# Patient Record
Sex: Female | Born: 1960
Health system: Southern US, Community
[De-identification: ages and names within clinical notes are randomized; demographics above are authoritative.]

## PROBLEM LIST (undated history)

## (undated) DIAGNOSIS — I10 Essential (primary) hypertension: Secondary | ICD-10-CM

## (undated) DIAGNOSIS — G510 Bell's palsy: Secondary | ICD-10-CM

## (undated) DIAGNOSIS — T7840XA Allergy, unspecified, initial encounter: Secondary | ICD-10-CM

## (undated) DIAGNOSIS — D649 Anemia, unspecified: Secondary | ICD-10-CM

## (undated) DIAGNOSIS — F32A Depression, unspecified: Secondary | ICD-10-CM

## (undated) DIAGNOSIS — Z5189 Encounter for other specified aftercare: Secondary | ICD-10-CM

## (undated) DIAGNOSIS — F329 Major depressive disorder, single episode, unspecified: Secondary | ICD-10-CM

## (undated) HISTORY — DX: Major depressive disorder, single episode, unspecified: F32.9

## (undated) HISTORY — DX: Encounter for other specified aftercare: Z51.89

## (undated) HISTORY — DX: Allergy, unspecified, initial encounter: T78.40XA

## (undated) HISTORY — PX: TONSILLECTOMY: SUR1361

## (undated) HISTORY — PX: CARPAL TUNNEL RELEASE: SHX101

## (undated) HISTORY — DX: Anemia, unspecified: D64.9

## (undated) HISTORY — DX: Depression, unspecified: F32.A

## (undated) HISTORY — DX: Essential (primary) hypertension: I10

## (undated) HISTORY — DX: Bell's palsy: G51.0

---

## 1999-02-13 ENCOUNTER — Ambulatory Visit (HOSPITAL_COMMUNITY): Admission: RE | Admit: 1999-02-13 | Discharge: 1999-02-13 | Payer: Self-pay | Admitting: Family Medicine

## 1999-02-13 ENCOUNTER — Encounter: Payer: Self-pay | Admitting: Family Medicine

## 1999-05-31 ENCOUNTER — Observation Stay (HOSPITAL_COMMUNITY): Admission: EM | Admit: 1999-05-31 | Discharge: 1999-06-01 | Payer: Self-pay | Admitting: Emergency Medicine

## 1999-05-31 ENCOUNTER — Encounter: Payer: Self-pay | Admitting: Emergency Medicine

## 1999-06-01 ENCOUNTER — Encounter: Payer: Self-pay | Admitting: Surgery

## 2000-03-09 ENCOUNTER — Encounter: Payer: Self-pay | Admitting: Family Medicine

## 2000-03-09 ENCOUNTER — Ambulatory Visit (HOSPITAL_COMMUNITY): Admission: RE | Admit: 2000-03-09 | Discharge: 2000-03-09 | Payer: Self-pay | Admitting: Family Medicine

## 2001-09-16 ENCOUNTER — Ambulatory Visit (HOSPITAL_COMMUNITY): Admission: RE | Admit: 2001-09-16 | Discharge: 2001-09-16 | Payer: Self-pay | Admitting: Family Medicine

## 2001-09-16 ENCOUNTER — Encounter: Payer: Self-pay | Admitting: Family Medicine

## 2002-11-10 ENCOUNTER — Ambulatory Visit (HOSPITAL_COMMUNITY): Admission: RE | Admit: 2002-11-10 | Discharge: 2002-11-10 | Payer: Self-pay | Admitting: Family Medicine

## 2002-11-10 ENCOUNTER — Encounter: Payer: Self-pay | Admitting: Family Medicine

## 2006-12-23 ENCOUNTER — Ambulatory Visit: Payer: Self-pay | Admitting: Cardiology

## 2007-09-16 ENCOUNTER — Ambulatory Visit (HOSPITAL_COMMUNITY): Admission: RE | Admit: 2007-09-16 | Discharge: 2007-09-16 | Payer: Self-pay | Admitting: Internal Medicine

## 2008-11-02 ENCOUNTER — Ambulatory Visit (HOSPITAL_COMMUNITY): Admission: RE | Admit: 2008-11-02 | Discharge: 2008-11-02 | Payer: Self-pay | Admitting: Internal Medicine

## 2009-12-04 ENCOUNTER — Ambulatory Visit (HOSPITAL_COMMUNITY): Admission: RE | Admit: 2009-12-04 | Discharge: 2009-12-04 | Payer: Self-pay | Admitting: Internal Medicine

## 2010-09-17 NOTE — Assessment & Plan Note (Signed)
[facility]                            CARDIOLOGY OFFICE NOTE   NAME:Sporer, TRANY CHERNICK                     MRN:          [id number]  DATE:12/23/2006                            DOB:          1960/11/13    CARDIAC CONSULTATION:   REASON FOR REFERRAL:  Management of hypertension.   CLINICAL HISTORY:  Ms. 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  She recently was seen at  Urgent Care for a nonrelated problem and was found to have an elevated  blood pressure.  She subsequently monitored her pressures at home and  they have been for the most part normal, although she does have readings  in the 145 to 150 range in the mornings.  She has been in the 180 to 200  range over 140 on occasions in the urgent medical center according to  her account.  She says she has no symptoms related to her high blood  pressure and specifically has no headache.  She also has no chest pain,  shortness of breath or palpitations.   PAST MEDICAL HISTORY:  1. Borderline diabetes.  She has a mildly elevated fasting blood sugar      and hemoglobin A1c and is apparently on a diet for this.  2. She has a history of two C-sections and wrist surgery and a D&C.   CURRENT MEDICATIONS:  Hydrochlorothiazide and Pepcid.   FAMILY HISTORY:  Her mother died at age 41 of breast cancer.  She also  had significant heart failure probably related to chemotherapy.  Her  father died at age 55 with Alzheimer's.  She has four siblings, none of  whom have heart disease.   SOCIAL HISTORY:  She is married and has two children, who are 26 and 41.  She does not smoke.   REVIEW OF SYSTEMS:  Positive for some lower extremity swelling and  headache.   On examination today, the blood pressure is 158/111, pulse 110 and  regular.  There was no venous distention.  The carotid pulses were full without  bruits.  CHEST:  Clear without rales or rhonchi.  CARDIAC:   Rhythm was regular.  The heart sounds were normal and there  were no murmurs or gallops.  ABDOMEN:  Soft, without organomegaly.  The peripheral pulses were full  and there was trace pitting edema and 1+ nonpitting edema.  MUSCULOSKELETAL:  No deformity.  SKIN:  Warm and dry.  NEUROLOGIC:  No focal neurologic signs.   An electrocardiogram showed sinus tachycardia and left axis deviation  and poor R-wave progression.   IMPRESSION:  1. Hypertension.  2. Borderline diabetes.  3. Excess weight.   RECOMMENDATIONS:  Most of Ms. Doyle's high blood pressure has been in  the office and she has been told she has white coat syndrome.  She was  referred here for possible ambulatory blood pressure monitoring.  Unfortunately, we do not have that currently available to [place] although I  will look into that possibility.  In the meantime we will get an  echocardiogram to see  if there has been any effect of high blood  pressure on her heart.  If she has LVH, then I think we are going to be  more aggressive in treating her.  She said she has been tried on a beta  blocker, seen now for blood pressure monitoring and tried on an ACE  inhibitor, which she did not tolerate.  We might consider a calcium  channel blocker if we decide to treat her.  She is to keep a record of  her blood pressures at home and when she comes in for her  echocardiogram, we will correlate her blood pressures at home with the  blood pressure in the office.  I will also be in touch with her by phone  and we will decide about a follow-up visit and how we should proceed  after we have that information.     Bruce Elvera Lennox Juanda Chance, MD, [facility]  Electronically Signed    BRB/MedQ  DD: 12/23/2006  DT: 12/24/2006  Job #: [id number]

## 2010-12-13 ENCOUNTER — Other Ambulatory Visit: Payer: Self-pay | Admitting: Internal Medicine

## 2010-12-13 DIAGNOSIS — Z1231 Encounter for screening mammogram for malignant neoplasm of breast: Secondary | ICD-10-CM

## 2010-12-19 ENCOUNTER — Ambulatory Visit (HOSPITAL_COMMUNITY)
Admission: RE | Admit: 2010-12-19 | Discharge: 2010-12-19 | Disposition: A | Discharge status: Home / Self Care | Payer: 59 | Source: Ambulatory Visit | Attending: Internal Medicine | Admitting: Internal Medicine

## 2010-12-19 DIAGNOSIS — Z1231 Encounter for screening mammogram for malignant neoplasm of breast: Secondary | ICD-10-CM

## 2010-12-24 ENCOUNTER — Other Ambulatory Visit: Payer: Self-pay | Admitting: Internal Medicine

## 2010-12-24 DIAGNOSIS — R928 Other abnormal and inconclusive findings on diagnostic imaging of breast: Secondary | ICD-10-CM

## 2010-12-30 ENCOUNTER — Ambulatory Visit
Admission: RE | Admit: 2010-12-30 | Discharge: 2010-12-30 | Disposition: A | Discharge status: Home / Self Care | Payer: 59 | Source: Ambulatory Visit | Attending: Internal Medicine | Admitting: Internal Medicine

## 2010-12-30 DIAGNOSIS — R928 Other abnormal and inconclusive findings on diagnostic imaging of breast: Secondary | ICD-10-CM

## 2012-10-12 ENCOUNTER — Other Ambulatory Visit: Payer: Self-pay

## 2012-10-12 DIAGNOSIS — Z1231 Encounter for screening mammogram for malignant neoplasm of breast: Secondary | ICD-10-CM

## 2012-11-01 ENCOUNTER — Ambulatory Visit
Admission: RE | Admit: 2012-11-01 | Discharge: 2012-11-01 | Disposition: A | Discharge status: Home / Self Care | Payer: BC Managed Care – PPO | Source: Ambulatory Visit

## 2012-11-01 DIAGNOSIS — Z1231 Encounter for screening mammogram for malignant neoplasm of breast: Secondary | ICD-10-CM

## 2013-07-26 ENCOUNTER — Ambulatory Visit (INDEPENDENT_AMBULATORY_CARE_PROVIDER_SITE_OTHER): Payer: BC Managed Care – PPO | Admitting: Physician Assistant

## 2013-07-26 VITALS — BP 162/94 | HR 79 | Temp 97.8°F | Resp 18 | Ht 60.0 in | Wt 249.0 lb

## 2013-07-26 DIAGNOSIS — Z8669 Personal history of other diseases of the nervous system and sense organs: Secondary | ICD-10-CM

## 2013-07-26 DIAGNOSIS — I10 Essential (primary) hypertension: Secondary | ICD-10-CM

## 2013-07-26 DIAGNOSIS — Z111 Encounter for screening for respiratory tuberculosis: Secondary | ICD-10-CM

## 2013-07-26 DIAGNOSIS — G510 Bell's palsy: Secondary | ICD-10-CM

## 2013-07-26 DIAGNOSIS — Z23 Encounter for immunization: Secondary | ICD-10-CM

## 2013-07-26 MED ORDER — OLMESARTAN MEDOXOMIL 20 MG PO TABS: 20.0000 mg | ORAL_TABLET | Freq: Every day | ORAL | Status: DC

## 2013-07-26 NOTE — Progress Notes (Signed)
Subjective:    Patient ID: Casey BurkittBridget L Conly, female    DOB: 09/14/1960, 53 y.o.   MRN: [id number]  HPI   Ms. Fredric MareBailey is a very pleasant 53 yr old female here with several concerns:  (1)  She is studying to be a Engineer, sitemedical assistant.  Needs Tdap, Hep B, and TB skin test for school.  She had one dose of Hep b a few years ago but no documentation so needs to restart the series.  Has had TSTs in the past, never positive.  Last had Tb quanitferon gold 2 yrs ago.  See TB questionnaire below  (2)  Needs RF on HTN medication.  Takes Toprol 25mg  once daily.  Did not take medication today.  Occ checks home BPs - usually 140s/80s.  Was on a different BP med in the past - unsure of the name - but this caused dizziness.  She is open to trying a different BP med.  She knows that her diet, stress, and obesity all contribute to her elevated BPs.  She denies CP, SOB, palpitations, HA.  (3)  Reports she has Bell's palsy - now 15 months out from initial diagnosis.  She has been taking valtrex 500mg  daily.  She continues to have residual facial paralysis.  She follows with optometry who has been treating her ocular symptoms.  Pt is frustrated with ongoing symptoms.  Some of the symptoms she initially had have resolved or improved but she continues to have decreased sensation on the right side of her face as well as paralysis. She occ has difficulty speaking due to difficulty moving the right side of her face. She denies word finding difficulty. She denies any other neurological symptoms including weakness, numbness, gait changes, other paresthesias    Tuberculosis Risk Questionnaire  1. No Were you born outside the [place] in one of the following parts of the world: [place], [place], [place], [place] or [place]?    2. Yes [place] x 3 months (20 yrs ago) Have you traveled outside the [place] and lived for more than one month in one of the following parts of the world: [place], [place], [place], [place]  or [place]?    3. No Do you have a compromised immune system such as from any of the following conditions:HIV/AIDS, organ or bone marrow transplantation, diabetes, immunosuppressive medicines (e.g. Prednisone, Remicaide), leukemia, lymphoma, cancer of the head or neck, gastrectomy or jejunal bypass, end-stage renal disease (on dialysis), or silicosis?     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    5. No Have you ever: injected illegal drugs, used crack cocaine, lived in a homeless shelter  or been in jail or prison?     6. No Have you ever been exposed to anyone with infectious tuberculosis?    Tuberculosis Symptom Questionnaire  Do you currently have any of the following symptoms?  1. No Unexplained cough lasting more than 3 weeks?   2. No Unexplained fever lasting more than 3 weeks.   3. No Night Sweats (sweating that leaves the bedclothes and sheets wet)     4. No Shortness of Breath   5. No Chest Pain   6. No Unintentional weight loss    7. No Unexplained fatigue (very tired for no reason)     Review of Systems  Constitutional:  Negative for fever and chills.  Respiratory: Negative for shortness of breath.   Cardiovascular: Negative for chest pain, palpitations and leg swelling.  Gastrointestinal: Negative.   Musculoskeletal: Negative.   Skin: Negative.   Neurological: Positive for facial asymmetry (bell's palsy x 15 months).       Objective:   Physical Exam  Vitals reviewed. Constitutional: She is oriented to person, place, and time. She appears well-developed and well-nourished. No distress.  HENT:  Head: Normocephalic and atraumatic.  Eyes: Conjunctivae are normal. No scleral icterus.  Cardiovascular: Normal rate, regular rhythm and normal heart sounds.   Pulmonary/Chest: Effort normal and breath sounds normal. She has no  wheezes. She has no rales.  Neurological: She is alert and oriented to person, place, and time. A cranial nerve deficit (facial paralysis R side, present x 15 months) is present.  Skin: Skin is warm and dry.  Psychiatric: She has a normal mood and affect. Her behavior is normal.        Assessment & Plan:  Hypertension - Plan: olmesartan (BENICAR) 20 MG tablet  Need for hepatitis B vaccination - Plan: Hepatitis B vaccine adult IM  Need for Tdap vaccination - Plan: Tdap vaccine greater than or equal to 7yo IM  Screening for tuberculosis - Plan: TB Skin Test  Facial paralysis on right side - Plan: Ambulatory referral to Neurology  History of Bell's palsy - Plan: Ambulatory referral to Neurology    Ms. Cansler is a pleasant 53 yr old female here with several concerns: (1)  Needs immunizations for school.  We have administered Tdap, Hep B, and placed a PPD today.  Pt to return in 48-72 for TB read and in 1 month for next Hep B.  (2)  HTN - moderately well controlled BP on 25mg  Toprol, home BPs 140s/80s - today pressure is 162/94 without medication.  I think we may be able to get better BP control with a different medication.  Pt thinks she may have tried lisinopril in the past but didn't tolerate.  Will start 20mg  olmesartan today.  Check home BPs 1-2 time per week.  Cautioned about highs and lows.  Pt to recheck in about 1 month - sooner if concerns  (3)  Pt reports onset of Bell's Palsy 15 months ago.  Persistent facial paralysis since that time.  She has no other neurologic symptoms.  Has been treated by optometry but has not otherwise sought care for this.  At this point I do not think valtrex is providing any benefit - can d/c this.  I think it is probably too late to see any benefit from steroids as well.  Will refer to neurology for input on management of pt's Bell's Palsy sequelae  Pt to call or RTC if worsening or not improving  E. Frances Furbish MHS, PA-C [facility] 3/25/20152:15 PM

## 2013-07-26 NOTE — Patient Instructions (Signed)
Stop taking the metoprolol.  Start taking the olmesartan (Benicar) 20mg  one daily.  Let me know how this is going.  Check your BP maybe 1-2x/wk.  If consistently >140 on top or >90 on bottom, we may need to increase the dose.  Conversely, if your BP is dropping too low, <110/<70 we may need to decrease the dose  Stop taking the valtrex - at this point I'm not sure you're getting any benefit from it.  I have put in a referral to see neurology, so you will get a phone call about setting this up   DASH Diet The DASH diet stands for "Dietary Approaches to Stop Hypertension." It is a healthy eating plan that has been shown to reduce high blood pressure (hypertension) in as little as 14 days, while also possibly providing other significant health benefits. These other health benefits include reducing the risk of breast cancer after menopause and reducing the risk of type 2 diabetes, heart disease, colon cancer, and stroke. Health benefits also include weight loss and slowing kidney failure in patients with chronic kidney disease.  DIET GUIDELINES  Limit salt (sodium). Your diet should contain less than 1500 mg of sodium daily.  Limit refined or processed carbohydrates. Your diet should include mostly whole grains. Desserts and added sugars should be used sparingly.  Include small amounts of heart-healthy fats. These types of fats include nuts, oils, and tub margarine. Limit saturated and trans fats. These fats have been shown to be harmful in the body. CHOOSING FOODS  The following food groups are based on a 2000 calorie diet. See your Registered Dietitian for individual calorie needs. Grains and Grain Products (6 to 8 servings daily)  Eat More Often: Whole-wheat bread, brown rice, whole-grain or wheat pasta, quinoa, popcorn without added fat or salt (air popped).  Eat Less Often: White bread, white pasta, white rice, cornbread. Vegetables (4 to 5 servings daily)  Eat More Often: Fresh, frozen, and  canned vegetables. Vegetables may be raw, steamed, roasted, or grilled with a minimal amount of fat.  Eat Less Often/Avoid: Creamed or fried vegetables. Vegetables in a cheese sauce. Fruit (4 to 5 servings daily)  Eat More Often: All fresh, canned (in natural juice), or frozen fruits. Dried fruits without added sugar. One hundred percent fruit juice ( cup [237 mL] daily).  Eat Less Often: Dried fruits with added sugar. Canned fruit in light or heavy syrup. Foot LockerLean Meats, Fish, and Poultry (2 servings or less daily. One serving is 3 to 4 oz [85-114 g]).  Eat More Often: Ninety percent or leaner ground beef, tenderloin, sirloin. Round cuts of beef, chicken breast, Malaw[place]key breast. All fish. Grill, bake, or broil your meat. Nothing should be fried.  Eat Less Often/Avoid: Fatty cuts of meat, [place]key, or chicken leg, thigh, or wing. Fried cuts of meat or fish. Dairy (2 to 3 servings)  Eat More Often: Low-fat or fat-free milk, low-fat plain or light yogurt, reduced-fat or part-skim cheese.  Eat Less Often/Avoid: Milk (whole, 2%).Whole milk yogurt. Full-fat cheeses. Nuts, Seeds, and Legumes (4 to 5 servings per week)  Eat More Often: All without added salt.  Eat Less Often/Avoid: Salted nuts and seeds, canned beans with added salt. Fats and Sweets (limited)  Eat More Often: Vegetable oils, tub margarines without trans fats, sugar-free gelatin. Mayonnaise and salad dressings.  Eat Less Often/Avoid: Coconut oils, palm oils, butter, stick margarine, cream, half and half, cookies, candy, pie. FOR MORE INFORMATION The Dash Diet Eating Plan: www.dashdiet.org Document  Released: 04/10/2011 Document Revised: 07/14/2011 Document Reviewed: 04/10/2011 [facility] Patient Information 2014 [place], [place].

## 2013-07-28 ENCOUNTER — Ambulatory Visit (INDEPENDENT_AMBULATORY_CARE_PROVIDER_SITE_OTHER): Payer: BC Managed Care – PPO | Admitting: *Deleted

## 2013-07-28 ENCOUNTER — Telehealth: Payer: Self-pay

## 2013-07-28 DIAGNOSIS — Z111 Encounter for screening for respiratory tuberculosis: Secondary | ICD-10-CM

## 2013-07-28 LAB — TB SKIN TEST
INDURATION: 0 mm
TB SKIN TEST: NEGATIVE

## 2013-07-28 MED ORDER — LOSARTAN POTASSIUM 25 MG PO TABS: 25.0000 mg | ORAL_TABLET | Freq: Every day | ORAL | Status: DC

## 2013-07-28 NOTE — Telephone Encounter (Signed)
Exp scripts faxed list of covered alternatives to the Benicar Rxd at [facility]. Elizabeth signed for change to losartan 25 and I am changing in EPIC. Notified pt.

## 2013-08-29 ENCOUNTER — Ambulatory Visit (INDEPENDENT_AMBULATORY_CARE_PROVIDER_SITE_OTHER): Payer: BC Managed Care – PPO | Admitting: *Deleted

## 2013-08-29 DIAGNOSIS — Z23 Encounter for immunization: Secondary | ICD-10-CM

## 2013-08-29 NOTE — Progress Notes (Signed)
   Subjective:    Patient ID: Tiffany BurkittBridget L Saari, female    DOB: 02/22/1961, 53 y.o.   MRN: [id number]  HPI  Pt here for her 2nd Hep B shot.  Review of Systems     Objective:   Physical Exam        Assessment & Plan:

## 2013-09-13 ENCOUNTER — Ambulatory Visit (INDEPENDENT_AMBULATORY_CARE_PROVIDER_SITE_OTHER): Payer: BC Managed Care – PPO | Admitting: Physician Assistant

## 2013-09-13 ENCOUNTER — Telehealth: Payer: Self-pay | Admitting: Genetic Counselor

## 2013-09-13 VITALS — BP 160/120 | HR 100 | Temp 98.5°F | Resp 16 | Ht 59.0 in | Wt 248.4 lb

## 2013-09-13 DIAGNOSIS — Z803 Family history of malignant neoplasm of breast: Secondary | ICD-10-CM

## 2013-09-13 DIAGNOSIS — R748 Abnormal levels of other serum enzymes: Secondary | ICD-10-CM

## 2013-09-13 DIAGNOSIS — Z23 Encounter for immunization: Secondary | ICD-10-CM

## 2013-09-13 DIAGNOSIS — I10 Essential (primary) hypertension: Secondary | ICD-10-CM

## 2013-09-13 DIAGNOSIS — G510 Bell's palsy: Secondary | ICD-10-CM

## 2013-09-13 DIAGNOSIS — Z0000 Encounter for general adult medical examination without abnormal findings: Secondary | ICD-10-CM

## 2013-09-13 LAB — COMPREHENSIVE METABOLIC PANEL
ALBUMIN: 4.1 g/dL (ref 3.5–5.2)
ALT: 17 U/L (ref 0–35)
AST: 18 U/L (ref 0–37)
Alkaline Phosphatase: 157 U/L — ABNORMAL HIGH (ref 39–117)
BUN: 13 mg/dL (ref 6–23)
CALCIUM: 9 mg/dL (ref 8.4–10.5)
CHLORIDE: 106 meq/L (ref 96–112)
CO2: 25 meq/L (ref 19–32)
Creat: 0.63 mg/dL (ref 0.50–1.10)
Glucose, Bld: 99 mg/dL (ref 70–99)
Potassium: 4.5 mEq/L (ref 3.5–5.3)
SODIUM: 140 meq/L (ref 135–145)
TOTAL PROTEIN: 7 g/dL (ref 6.0–8.3)
Total Bilirubin: 0.4 mg/dL (ref 0.2–1.2)

## 2013-09-13 LAB — POCT URINALYSIS DIPSTICK
BILIRUBIN UA: NEGATIVE
Blood, UA: NEGATIVE
Glucose, UA: NEGATIVE
KETONES UA: NEGATIVE
LEUKOCYTES UA: NEGATIVE
Nitrite, UA: POSITIVE
PH UA: 6.5
Protein, UA: NEGATIVE
Spec Grav, UA: 1.02
Urobilinogen, UA: 0.2

## 2013-09-13 LAB — LIPID PANEL
CHOLESTEROL: 191 mg/dL (ref 0–200)
HDL: 42 mg/dL (ref >39)
LDL Cholesterol: 126 mg/dL — ABNORMAL HIGH (ref 0–99)
Total CHOL/HDL Ratio: 4.5 Ratio
Triglycerides: 117 mg/dL (ref <150)
VLDL: 23 mg/dL (ref 0–40)

## 2013-09-13 LAB — POCT CBC
Granulocyte percent: 73 %G (ref 37–80)
HEMATOCRIT: 43.9 % (ref 37.7–47.9)
HEMOGLOBIN: 13.9 g/dL (ref 12.2–16.2)
LYMPH, POC: 2.4 (ref 0.6–3.4)
MCH, POC: 27.4 pg (ref 27–31.2)
MCHC: 31.7 g/dL — AB (ref 31.8–35.4)
MCV: 86.4 fL (ref 80–97)
MID (cbc): 0.8 (ref 0–0.9)
MPV: 8.5 fL (ref 0–99.8)
POC GRANULOCYTE: 8.5 — AB (ref 2–6.9)
POC LYMPH PERCENT: 20.1 %L (ref 10–50)
POC MID %: 6.9 %M (ref 0–12)
Platelet Count, POC: 366 10*3/uL (ref 142–424)
RBC: 5.08 M/uL (ref 4.04–5.48)
RDW, POC: 16.3 %
WBC: 11.7 10*3/uL — AB (ref 4.6–10.2)

## 2013-09-13 LAB — TSH: TSH: 2.013 u[IU]/mL (ref 0.350–4.500)

## 2013-09-13 MED ORDER — LOSARTAN POTASSIUM 50 MG PO TABS: 50.0000 mg | ORAL_TABLET | Freq: Every day | ORAL | Status: DC

## 2013-09-13 NOTE — Telephone Encounter (Signed)
S/W PATIENT AND GAVE NP APPT FOR 06/19 @ 2:30 W/GENETIC COUNSELOR.  WELCOME PACKET MAILED..Marland Kitchen

## 2013-09-13 NOTE — Progress Notes (Signed)
   Subjective:    Patient ID: Tiffany Reynolds, female    DOB: 10/09/1960, 53 y.o.   MRN: [id number]  HPI    Review of Systems  Constitutional: Positive for fatigue.  HENT: Positive for drooling and facial swelling.   Eyes: Positive for photophobia, discharge and itching.  Respiratory: Negative.   Cardiovascular: Negative.   Gastrointestinal: Negative.   Endocrine: Positive for heat intolerance.  Genitourinary: Negative.   Musculoskeletal: Negative.   Skin: Negative.   Allergic/Immunologic: Positive for environmental allergies.  Neurological: Positive for facial asymmetry and speech difficulty.  Hematological: Negative.   Psychiatric/Behavioral: Negative.        Objective:   Physical Exam        Assessment & Plan:

## 2013-09-13 NOTE — Telephone Encounter (Signed)
LEFT MESSAGE FOR PATIENT TO RETURN CALL TO SCHEDULE GENETIC APPT.  °

## 2013-09-13 NOTE — Patient Instructions (Signed)
(1)  Increase losartan to $RemoveBef'50mg'sxqYgNfhoM$  once daily.  [facility].  For now, just take 2 of the $Remo'25mg'uRhjj$  tabs until you run out.  Keep checking your BPs periodically.  If it is remaining >140/>90 please let me know as we may need to adjust your medication further  (2)  Continue working on incorporating exercise every day - if you are just going for a walk.  This will have a positive impact on your blood pressure.  Continue making healthy dietary choices like you have been.  Keep up the good work!  (3)  Plan to have a pap test in the next few months.  You can schedule an appointment for this, or you can always walk in.  I would recommend doing a pap and HPV co-testing, so that you will only have to have this done every 5 years instead of 3  (4)  I have referred you to meet with the genetic counselor at the cancer center to talk about your breast cancer risk and doing BRCA testing.  You should get a phone call about setting this up  (5)  I have also referred you to the [facility] for a second opinion on your eye symptoms.  You will get a call about this as well  (6)  We have given you another Td booster today, so you meet the requirements for school. Come back for your last hepatitis B vaccine in about 6 months  (7) I will let you know when your labs are back and if we need to do anything else based on them   Health Maintenance, Female A healthy lifestyle and preventative care can promote health and wellness.  Maintain regular health, dental, and eye exams.  Eat a healthy diet. Foods like vegetables, fruits, whole grains, low-fat dairy products, and lean protein foods contain the nutrients you need without too many calories. Decrease your intake of foods high in solid fats, added sugars, and salt. Get information about a proper diet from your caregiver, if necessary.  Regular physical exercise is one of the most important things you can do for your health. Most adults should get at  least 150 minutes of moderate-intensity exercise (any activity that increases your heart rate and causes you to sweat) each week. In addition, most adults need muscle-strengthening exercises on 2 or more days a week.   Maintain a healthy weight. The body mass index (BMI) is a screening tool to identify possible weight problems. It provides an estimate of body fat based on height and weight. Your caregiver can help determine your BMI, and can help you achieve or maintain a healthy weight. For adults 20 years and older:  A BMI below 18.5 is considered underweight.  A BMI of 18.5 to 24.9 is normal.  A BMI of 25 to 29.9 is considered overweight.  A BMI of 30 and above is considered obese.  Maintain normal blood lipids and cholesterol by exercising and minimizing your intake of saturated fat. Eat a balanced diet with plenty of fruits and vegetables. Blood tests for lipids and cholesterol should begin at age 41 and be repeated every 5 years. If your lipid or cholesterol levels are high, you are over 50, or you are a high risk for heart disease, you may need your cholesterol levels checked more frequently.Ongoing high lipid and cholesterol levels should be treated with medicines if diet and exercise are not effective.  If you smoke, find out from your  caregiver how to quit. If you do not use tobacco, do not start.  Lung cancer screening is recommended for adults aged 58 80 years who are at high risk for developing lung cancer because of a history of smoking. Yearly low-dose computed tomography (CT) is recommended for people who have at least a 30-pack-year history of smoking and are a current smoker or have quit within the past 15 years. A pack year of smoking is smoking an average of 1 pack of cigarettes a day for 1 year (for example: 1 pack a day for 30 years or 2 packs a day for 15 years). Yearly screening should continue until the smoker has stopped smoking for at least 15 years. Yearly screening  should also be stopped for people who develop a health problem that would prevent them from having lung cancer treatment.  If you are pregnant, do not drink alcohol. If you are breastfeeding, be very cautious about drinking alcohol. If you are not pregnant and choose to drink alcohol, do not exceed 1 drink per day. One drink is considered to be 12 ounces (355 mL) of beer, 5 ounces (148 mL) of wine, or 1.5 ounces (44 mL) of liquor.  Avoid use of street drugs. Do not share needles with anyone. Ask for help if you need support or instructions about stopping the use of drugs.  High blood pressure causes heart disease and increases the risk of stroke. Blood pressure should be checked at least every 1 to 2 years. Ongoing high blood pressure should be treated with medicines, if weight loss and exercise are not effective.  If you are 17 to 55 years old, ask your caregiver if you should take aspirin to prevent strokes.  Diabetes screening involves taking a blood sample to check your fasting blood sugar level. This should be done once every 3 years, after age 82, if you are within normal weight and without risk factors for diabetes. Testing should be considered at a younger age or be carried out more frequently if you are overweight and have at least 1 risk factor for diabetes.  Breast cancer screening is essential preventative care for women. You should practice "breast self-awareness." This means understanding the normal appearance and feel of your breasts and may include breast self-examination. Any changes detected, no matter how small, should be reported to a caregiver. Women in their 66s and 30s should have a clinical breast exam (CBE) by a caregiver as part of a regular health exam every 1 to 3 years. After age 38, women should have a CBE every year. Starting at age 64, women should consider having a mammogram (breast X-ray) every year. Women who have a family history of breast cancer should talk to their  caregiver about genetic screening. Women at a high risk of breast cancer should talk to their caregiver about having an MRI and a mammogram every year.  Breast cancer gene (BRCA)-related cancer risk assessment is recommended for women who have family members with BRCA-related cancers. BRCA-related cancers include breast, ovarian, tubal, and peritoneal cancers. Having family members with these cancers may be associated with an increased risk for harmful changes (mutations) in the breast cancer genes BRCA1 and BRCA2. Results of the assessment will determine the need for genetic counseling and BRCA1 and BRCA2 testing.  The Pap test is a screening test for cervical cancer. Women should have a Pap test starting at age 34. Between ages 62 and 27, Pap tests should be repeated every 2 years. Beginning  at age 72, you should have a Pap test every 3 years as long as the past 3 Pap tests have been normal. If you had a hysterectomy for a problem that was not cancer or a condition that could lead to cancer, then you no longer need Pap tests. If you are between ages 83 and 49, and you have had normal Pap tests going back 10 years, you no longer need Pap tests. If you have had past treatment for cervical cancer or a condition that could lead to cancer, you need Pap tests and screening for cancer for at least 20 years after your treatment. If Pap tests have been discontinued, risk factors (such as a new sexual partner) need to be reassessed to determine if screening should be resumed. Some women have medical problems that increase the chance of getting cervical cancer. In these cases, your caregiver may recommend more frequent screening and Pap tests.  The human papillomavirus (HPV) test is an additional test that may be used for cervical cancer screening. The HPV test looks for the virus that can cause the cell changes on the cervix. The cells collected during the Pap test can be tested for HPV. The HPV test could be used to  screen women aged 75 years and older, and should be used in women of any age who have unclear Pap test results. After the age of 96, women should have HPV testing at the same frequency as a Pap test.  Colorectal cancer can be detected and often prevented. Most routine colorectal cancer screening begins at the age of 33 and continues through age 59. However, your caregiver may recommend screening at an earlier age if you have risk factors for colon cancer. On a yearly basis, your caregiver may provide home test kits to check for hidden blood in the stool. Use of a small camera at the end of a tube, to directly examine the colon (sigmoidoscopy or colonoscopy), can detect the earliest forms of colorectal cancer. Talk to your caregiver about this at age 36, when routine screening begins. Direct examination of the colon should be repeated every 5 to 10 years through age 24, unless early forms of pre-cancerous polyps or small growths are found.  Hepatitis C blood testing is recommended for all people born from [birth year] through 1967 and any individual with known risks for hepatitis C.  Practice safe sex. Use condoms and avoid high-risk sexual practices to reduce the spread of sexually transmitted infections (STIs). Sexually active women aged 59 and younger should be checked for Chlamydia, which is a common sexually transmitted infection. Older women with new or multiple partners should also be tested for Chlamydia. Testing for other STIs is recommended if you are sexually active and at increased risk.  Osteoporosis is a disease in which the bones lose minerals and strength with aging. This can result in serious bone fractures. The risk of osteoporosis can be identified using a bone density scan. Women ages 36 and over and women at risk for fractures or osteoporosis should discuss screening with their caregivers. Ask your caregiver whether you should be taking a calcium supplement or vitamin D to reduce the rate of  osteoporosis.  Menopause can be associated with physical symptoms and risks. Hormone replacement therapy is available to decrease symptoms and risks. You should talk to your caregiver about whether hormone replacement therapy is right for you.  Use sunscreen. Apply sunscreen liberally and repeatedly throughout the day. You should seek shade when your shadow  is shorter than you. Protect yourself by wearing long sleeves, pants, a wide-brimmed hat, and sunglasses year round, whenever you are outdoors.  Notify your caregiver of new moles or changes in moles, especially if there is a change in shape or color. Also notify your caregiver if a mole is larger than the size of a pencil eraser.  Stay current with your immunizations. Document Released: 11/04/2010 Document Revised: 08/16/2012 Document Reviewed: 11/04/2010 [facility] Patient Information 2014 [place].
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
## 2013-09-14 ENCOUNTER — Encounter: Payer: Self-pay | Admitting: Physician Assistant

## 2013-09-21 ENCOUNTER — Telehealth: Payer: Self-pay

## 2013-09-21 NOTE — Telephone Encounter (Signed)
[facility] from [facility], she states that Dr Katrinka BlazingSmith referred this pt to their office, Marisue IvanLiz is requesting labs from her last physical to be faxed to her at [phone] The phone number for Marisue IvanLiz is [phone]

## 2013-10-20 ENCOUNTER — Telehealth: Payer: Self-pay

## 2013-10-20 NOTE — Telephone Encounter (Signed)
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  Requesting change in medication for the following reasons:  Fecal incontinence  Extreme fatigue   [facility] [address]  [phone]

## 2013-10-20 NOTE — Telephone Encounter (Signed)
Based on review of her chart, it appears that she was tolerating the losartan at the 25 mg dose. If that is the case, recommend reducing it back to that at adding chlorthalidone 25 mg to it (OK to send in chlorthalidone 25 mg, 1 PO QAM, #30, RF x 2) and re-evaluate in 4 weeks.

## 2013-10-21 ENCOUNTER — Other Ambulatory Visit: Payer: BC Managed Care – PPO

## 2013-10-21 ENCOUNTER — Ambulatory Visit (HOSPITAL_BASED_OUTPATIENT_CLINIC_OR_DEPARTMENT_OTHER): Payer: BC Managed Care – PPO | Admitting: Genetic Counselor

## 2013-10-21 DIAGNOSIS — IMO0002 Reserved for concepts with insufficient information to code with codable children: Secondary | ICD-10-CM

## 2013-10-21 DIAGNOSIS — Z803 Family history of malignant neoplasm of breast: Secondary | ICD-10-CM | POA: Insufficient documentation

## 2013-10-21 NOTE — Telephone Encounter (Signed)
Pt can not tolerate Losartan. It gives her diarrhea to the point she is unable to control it and she becomes extremely fatigued.  She want to change this medication to something else. Should we have her come into the office?

## 2013-10-21 NOTE — Telephone Encounter (Signed)
I am trying to determine the next medication for her - Why was she switched from Benicar?  Has she ever tried Lisinopril?  I see that patient has been on metoprolol but she did not get good HTN control.

## 2013-10-21 NOTE — Progress Notes (Signed)
HISTORY OF PRESENT ILLNESS: Dr. Debbra RidingEgan requested a cancer genetics consultation for Tiffany Reynolds, a 53 y.o. female, due to a family history of cancer.  Tiffany Reynolds presents to clinic today to discuss the possibility of a hereditary predisposition to cancer, genetic testing, and to further clarify her future cancer risks, as well as potential cancer risk for family members. Tiffany Reynolds has no personal history of cancer.   Past Medical History  Diagnosis Date   Anemia    Blood transfusion without reported diagnosis    Depression    Hypertension    Bell's palsy    Allergy     Past Surgical History  Procedure Laterality Date   Cesarean section     Carpal tunnel release     Tonsillectomy      History   Social History   Marital Status: Married    Spouse Name: N/A    Number of Children: N/A   Years of Education: N/A   Occupational History   CNA/CMA student    Social History Main Topics   Smoking status: Never Smoker    Smokeless tobacco: Not on file   Alcohol Use: No   Drug Use: No   Sexual Activity: Not on file   Other Topics Concern   Not on file   Social History Narrative   Married. Education: [employer].    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 Significant diagnoses include the following:  Family History  Problem Relation Age of Onset   Cancer Mother [id number]    breast   Hyperlipidemia Father    Cancer Maternal Grandmother 1968    breast   Cancer Paternal Aunt [id number]    breast    Tiffany Reynolds's ancestry is of Caucasian descent. There is no known Jewish ancestry or consanguinity.  GENETIC COUNSELING ASSESSMENT: Tiffany Reynolds is a 53 y.o. female with a family history of cancer which is not highly suggestive of a hereditary predisposition to cancer. We, therefore,  discussed and recommended the following at today's visit.   DISCUSSION / PLAN: We reviewed the characteristics, features and inheritance patterns of hereditary cancer syndromes. We also discussed genetic testing, including the appropriate family members to test, the process of testing and  insurance coverage. Given the age of breast cancer diagnoses in the family, in combination with the fact that there are several female relatives that lived cancer-free to older ages on both sides of the family, we discussed with Tiffany Reynolds that the family history is not highly consistent with a familial hereditary cancer syndrome, and we feel she is at low risk to harbor a gene mutation associated with such a condition. Thus, we did not recommend any genetic testing, at this time. We recommended Tiffany Reynolds continue to follow the cancer screening guidelines given to he by her primary provider.  We also encouraged Tiffany Reynolds to remain in contact with cancer genetics annually so that we can continuously update the family history and inform her of any changes in cancer genetics and testing that may be of benefit for this family. Ms.  Tiffany Reynolds's questions were answered to her satisfaction today. Our contact information was provided should additional questions or concerns arise.   Thank you for the referral and allowing us to share in the care of your patient.  The patient was seen for a total of 40 minutes in face-to-face genetic counseling.  This patient was discussed with Magrinat who agrees with the above.    _______________________________________________________________________ For Office Staff:  Number of people involved in session: 2 Was an Intern/ student involved with case: no

## 2013-10-21 NOTE — Telephone Encounter (Signed)
She called back today in response to the vm left my sara to say that she "cannot tolerate the symptoms".

## 2013-10-21 NOTE — Telephone Encounter (Signed)
Lm for pt rtn call

## 2013-10-24 NOTE — Telephone Encounter (Signed)
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 They needed to try Losartan which causes fecal incontinence and severe fatigue. She was on Metoprolol, she does not recall the results when taking it. She has stopped Losartan at this point she is not using any BP medication.

## 2013-10-25 MED ORDER — OLMESARTAN MEDOXOMIL 20 MG PO TABS: 20.0000 mg | ORAL_TABLET | Freq: Every day | ORAL | Status: DC

## 2013-10-25 NOTE — Telephone Encounter (Signed)
Had to resend Rx to [facility] instead of mail order per pt request.  LM-Advised pt rx was sent to the pharmacy.

## 2013-10-25 NOTE — Telephone Encounter (Signed)
We will try benicar again because it looks like her insurance will pay for that now.

## 2013-11-30 ENCOUNTER — Other Ambulatory Visit (INDEPENDENT_AMBULATORY_CARE_PROVIDER_SITE_OTHER): Payer: BC Managed Care – PPO | Admitting: *Deleted

## 2013-11-30 DIAGNOSIS — R748 Abnormal levels of other serum enzymes: Secondary | ICD-10-CM

## 2013-12-01 LAB — COMPREHENSIVE METABOLIC PANEL
ALBUMIN: 4.3 g/dL (ref 3.5–5.2)
ALT: 21 U/L (ref 0–35)
AST: 18 U/L (ref 0–37)
Alkaline Phosphatase: 146 U/L — ABNORMAL HIGH (ref 39–117)
BUN: 18 mg/dL (ref 6–23)
CO2: 24 meq/L (ref 19–32)
Calcium: 9.3 mg/dL (ref 8.4–10.5)
Chloride: 105 mEq/L (ref 96–112)
Creat: 0.71 mg/dL (ref 0.50–1.10)
GLUCOSE: 105 mg/dL — AB (ref 70–99)
POTASSIUM: 4.3 meq/L (ref 3.5–5.3)
SODIUM: 140 meq/L (ref 135–145)
TOTAL PROTEIN: 7.4 g/dL (ref 6.0–8.3)
Total Bilirubin: 0.4 mg/dL (ref 0.2–1.2)

## 2013-12-06 ENCOUNTER — Telehealth: Payer: Self-pay | Admitting: *Deleted

## 2013-12-06 NOTE — Telephone Encounter (Signed)
Returned pts call in regards to lab results. Doesn't look like they have yet been reviewed. She was concerned about her alkaline phosphatase since it was high and she had it rechecked on 7/29. Please advise.

## 2014-02-22 ENCOUNTER — Ambulatory Visit (INDEPENDENT_AMBULATORY_CARE_PROVIDER_SITE_OTHER): Payer: BC Managed Care – PPO | Admitting: *Deleted

## 2014-02-22 DIAGNOSIS — Z23 Encounter for immunization: Secondary | ICD-10-CM

## 2015-04-27 ENCOUNTER — Ambulatory Visit (INDEPENDENT_AMBULATORY_CARE_PROVIDER_SITE_OTHER): Payer: BLUE CROSS/BLUE SHIELD | Admitting: Emergency Medicine

## 2015-04-27 VITALS — BP 164/116 | HR 97 | Temp 98.0°F | Resp 18 | Ht 59.0 in | Wt 263.0 lb

## 2015-04-27 DIAGNOSIS — IMO0001 Reserved for inherently not codable concepts without codable children: Secondary | ICD-10-CM

## 2015-04-27 DIAGNOSIS — Z1211 Encounter for screening for malignant neoplasm of colon: Secondary | ICD-10-CM

## 2015-04-27 DIAGNOSIS — I10 Essential (primary) hypertension: Secondary | ICD-10-CM | POA: Diagnosis not present

## 2015-04-27 DIAGNOSIS — R030 Elevated blood-pressure reading, without diagnosis of hypertension: Secondary | ICD-10-CM | POA: Diagnosis not present

## 2015-04-27 MED ORDER — LOSARTAN POTASSIUM-HCTZ 50-12.5 MG PO TABS: 1.0000 | ORAL_TABLET | Freq: Every day | ORAL | Status: DC

## 2015-04-27 NOTE — Patient Instructions (Signed)
Hypertension Hypertension, commonly called high blood pressure, is when the force of blood pumping through your arteries is too strong. Your arteries are the blood vessels that carry blood from your heart throughout your body. A blood pressure reading consists of a higher number over a lower number, such as 110/72. The higher number (systolic) is the pressure inside your arteries when your heart pumps. The lower number (diastolic) is the pressure inside your arteries when your heart relaxes. Ideally you want your blood pressure below 120/80. Hypertension forces your heart to work harder to pump blood. Your arteries may become narrow or stiff. Having untreated or uncontrolled hypertension can cause heart attack, stroke, kidney disease, and other problems. RISK FACTORS Some risk factors for high blood pressure are controllable. Others are not.  Risk factors you cannot control include:   Race. You may be at higher risk if you are African American.  Age. Risk increases with age.  Gender. Men are at higher risk than women before age 45 years. After age 65, women are at higher risk than men. Risk factors you can control include:  Not getting enough exercise or physical activity.  Being overweight.  Getting too much fat, sugar, calories, or salt in your diet.  Drinking too much alcohol. SIGNS AND SYMPTOMS Hypertension does not usually cause signs or symptoms. Extremely high blood pressure (hypertensive crisis) may cause headache, anxiety, shortness of breath, and nosebleed. DIAGNOSIS To check if you have hypertension, your health care provider will measure your blood pressure while you are seated, with your arm held at the level of your heart. It should be measured at least twice using the same arm. Certain conditions can cause a difference in blood pressure between your right and left arms. A blood pressure reading that is higher than normal on one occasion does not mean that you need treatment. If  it is not clear whether you have high blood pressure, you may be asked to return on a different day to have your blood pressure checked again. Or, you may be asked to monitor your blood pressure at home for 1 or more weeks. TREATMENT Treating high blood pressure includes making lifestyle changes and possibly taking medicine. Living a healthy lifestyle can help lower high blood pressure. You may need to change some of your habits. Lifestyle changes may include:  Following the DASH diet. This diet is high in fruits, vegetables, and whole grains. It is low in salt, red meat, and added sugars.  Keep your sodium intake below 2,300 mg per day.  Getting at least 30-45 minutes of aerobic exercise at least 4 times per week.  Losing weight if necessary.  Not smoking.  Limiting alcoholic beverages.  Learning ways to reduce stress. Your health care provider may prescribe medicine if lifestyle changes are not enough to get your blood pressure under control, and if one of the following is true:  You are 18-59 years of age and your systolic blood pressure is above 140.  You are 60 years of age or older, and your systolic blood pressure is above 150.  Your diastolic blood pressure is above 90.  You have diabetes, and your systolic blood pressure is over 140 or your diastolic blood pressure is over 90.  You have kidney disease and your blood pressure is above 140/90.  You have heart disease and your blood pressure is above 140/90. Your personal target blood pressure may vary depending on your medical conditions, your age, and other factors. HOME CARE INSTRUCTIONS    Have your blood pressure rechecked as directed by your health care provider.   Take medicines only as directed by your health care provider. Follow the directions carefully. Blood pressure medicines must be taken as prescribed. The medicine does not work as well when you skip doses. Skipping doses also puts you at risk for  problems.  Do not smoke.   Monitor your blood pressure at home as directed by your health care provider. SEEK MEDICAL CARE IF:   You think you are having a reaction to medicines taken.  You have recurrent headaches or feel dizzy.  You have swelling in your ankles.  You have trouble with your vision. SEEK IMMEDIATE MEDICAL CARE IF:  You develop a severe headache or confusion.  You have unusual weakness, numbness, or feel faint.  You have severe chest or abdominal pain.  You vomit repeatedly.  You have trouble breathing. MAKE SURE YOU:   Understand these instructions.  Will watch your condition.  Will get help right away if you are not doing well or get worse.   This information is not intended to replace advice given to you by your health care provider. Make sure you discuss any questions you have with your health care provider.   Document Released: 04/21/2005 Document Revised: 09/05/2014 Document Reviewed: 02/11/2013 Elsevier Interactive Patient Education 2016 Elsevier Inc.  

## 2015-04-27 NOTE — Progress Notes (Signed)
Subjective:  Patient ID: Tiffany Reynolds, female    DOB: 11/15/1960  Age: 54 y.o. MRN: [id number]  CC: Medication Refill   HPI Tiffany Reynolds presents  patient has a history of hypertension been out of her medicine for at least 6 weeks she is asymptomatic. She went to a physical examination today for new job and they told her that her blood pressure was out of control. Was 190/100 and she is asymptomatic she has no indication of any endorgan injury.  History Tiffany Reynolds has a past medical history of Anemia; Blood transfusion without reported diagnosis; Depression; Hypertension; Bell's palsy; and Allergy.   She has past surgical history that includes Cesarean section; Carpal tunnel release; and Tonsillectomy.   Her  family history includes Cancer (age of onset: 65) in her mother; Cancer (age of onset: 56) in her maternal grandmother; Cancer (age of onset: 21) in her paternal aunt; Hyperlipidemia in her father.  She   reports that she has never smoked. She does not have any smokeless tobacco history on file. She reports that she does not drink alcohol or use illicit drugs.  Outpatient Prescriptions Prior to Visit  Medication Sig Dispense Refill  . losartan (COZAAR) 50 MG tablet Take 1 tablet (50 mg total) by mouth daily. 90 tablet 1  . losartan (COZAAR) 25 MG tablet Take 1 tablet (25 mg total) by mouth daily. (Patient not taking: Reported on 04/27/2015) 90 tablet 1  . olmesartan (BENICAR) 20 MG tablet Take 1 tablet (20 mg total) by mouth daily. (Patient not taking: Reported on 04/27/2015) 30 tablet 0  . valACYclovir (VALTREX) 500 MG tablet Take 500 mg by mouth 2 (two) times daily. Reported on 04/27/2015     No facility-administered medications prior to visit.    Social History   Social History  . Marital Status: Married    Spouse Name: N/A  . Number of Children: N/A  . Years of Education: N/A   Occupational History  . CNA/CMA student    Social History Main Topics  . Smoking  status: Never Smoker   . Smokeless tobacco: None  . Alcohol Use: No  . Drug Use: No  . Sexual Activity: Not Asked   Other Topics Concern  . None   Social History Narrative   Married. Education: [employer].    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  HENT: Negative for congestion, ear pain, postnasal drip, sinus pressure and sore throat.   Eyes: Negative for pain and redness.  Respiratory: Negative for cough, shortness of breath and wheezing.   Cardiovascular: Negative for leg swelling.  Gastrointestinal: Negative for nausea, vomiting, abdominal pain, diarrhea, constipation and blood in stool.  Endocrine: Negative for polyuria.  Genitourinary: Negative for dysuria, urgency, frequency and flank pain.  Musculoskeletal: Negative for gait problem.  Skin: Negative for rash.  Neurological: Negative for weakness and headaches.  Psychiatric/Behavioral: Negative for confusion and decreased concentration. The patient is not nervous/anxious.     Objective:  BP 164/116 mmHg  Pulse 97  Temp(Src) 98 F (36.7 C)  Resp 18  Ht  (1.499 m)  Wt 263 lb (119.296 kg)  BMI 53.09 kg/m2  SpO2 98%  LMP 12/18/2010  Physical Exam  Constitutional: She is oriented to person, place, and time. She appears well-developed and  well-nourished.  HENT:  Head: Normocephalic and atraumatic.  Eyes: Conjunctivae are normal. Pupils are equal, round, and reactive to light.  Pulmonary/Chest: Effort normal.  Musculoskeletal: She exhibits no edema.  Neurological: She is alert and oriented to person, place, and time.  Skin: Skin is dry.  Psychiatric: She has a normal mood and affect. Her behavior is normal. Thought content normal.      Assessment & Plan:   Tiffany Reynolds was seen today for medication  refill.  Diagnoses and all orders for this visit:  Essential hypertension, benign  Elevated BP  Special screening for malignant neoplasms, colon -     Ambulatory referral to Gastroenterology  Other orders -     losartan-hydrochlorothiazide (HYZAAR) 50-12.5 MG tablet; Take 1 tablet by mouth daily.   I am having Tiffany Reynolds start on losartan-hydrochlorothiazide. I am also having her maintain her valACYclovir, losartan, losartan, and olmesartan.  Meds ordered this encounter  Medications  . losartan-hydrochlorothiazide (HYZAAR) 50-12.5 MG tablet    Sig: Take 1 tablet by mouth daily.    Dispense:  90 tablet    Refill:  0   He was instructed to follow-up prior to her refill of her medication because we have never seen her with adequate adequate control of her blood pressure on a visit and before we refill her medicine attending that's important to verify  Appropriate red flag conditions were discussed with the patient as well as actions that should be taken.  Patient expressed his understanding.  Follow-up: Return in about 3 months (around 07/26/2015).  Carmelina DaneAnderson, Dajanique Robley S, MD

## 2015-04-30 ENCOUNTER — Ambulatory Visit (INDEPENDENT_AMBULATORY_CARE_PROVIDER_SITE_OTHER): Payer: BLUE CROSS/BLUE SHIELD | Admitting: Family Medicine

## 2015-04-30 VITALS — BP 158/114 | HR 116 | Temp 97.4°F | Resp 20

## 2015-04-30 DIAGNOSIS — M25511 Pain in right shoulder: Secondary | ICD-10-CM

## 2015-04-30 MED ORDER — PREDNISONE 20 MG PO TABS: ORAL_TABLET | ORAL | Status: DC

## 2015-04-30 MED ORDER — HYDROCODONE-ACETAMINOPHEN 5-325 MG PO TABS: 1.0000 | ORAL_TABLET | Freq: Four times a day (QID) | ORAL | Status: DC | PRN

## 2015-04-30 NOTE — Progress Notes (Signed)
 @  By signing my name below, I, Raven Small, attest that this documentation has been prepared under the direction and in the presence of Elvina Sidle, MD.  Electronically Signed: Andrew Au, ED Scribe. 04/30/2015. 6:20 PM.  Patient ID: HARNEET NOBLETT MRN: [id number], DOB: 10/01/1960, 54 y.o. Date of Encounter: 04/30/2015, 6:10 PM  Primary Physician: No PCP Per Patient  Chief Complaint:  Chief Complaint  Patient presents with  . Other    Right side pain, shoulder, arm    HPI: 54 y.o. year old female with history below presents with gradually worsening, radiating, right shoulder pain that began 3 days ago. Pt states pain radiates from right shoulder down lateral right arm to right elbow. States she had blood drawn from right arm and received flu shot in left arm 1 day prior to pain. She reports worsening pain with movement of right arm and prefers not to move arm. She has tried benadryl, heat and ice without relief to symptoms. She has taken hydrocodone in the past.  Pt BP is elevated at 158/114. She recently had a medication change from Cozaar to hyzaar. States her BP was 138/64 this morning.   Pt works as a Clinical biochemist.   Past Medical History  Diagnosis Date  . Anemia   . Blood transfusion without reported diagnosis   . Depression   . Hypertension   . Bell's palsy   . Allergy      Home Meds: Prior to Admission medications   Medication Sig Start Date End Date Taking? Authorizing Provider  losartan (COZAAR) 25 MG tablet Take 1 tablet (25 mg total) by mouth daily. 07/28/13  Yes Eleanore E Egan, PA-C  losartan (COZAAR) 50 MG tablet Take 1 tablet (50 mg total) by mouth daily. 09/13/13  Yes Eleanore Delia Chimes, PA-C  losartan-hydrochlorothiazide (HYZAAR) 50-12.5 MG tablet Take 1 tablet by mouth daily. 04/27/15  Yes Carmelina Dane, MD  olmesartan (BENICAR) 20 MG tablet Take 1 tablet (20 mg total) by mouth daily. 10/25/13  Yes Morrell Riddle, PA-C  valACYclovir (VALTREX) 500 MG  tablet Take 500 mg by mouth 2 (two) times daily. Reported on 04/27/2015   Yes Historical Provider, MD    Allergies:  Allergies  Allergen Reactions  . Anaprox [Naproxen Sodium]   . Vicodin [Hydrocodone-Acetaminophen]     Social History   Social History  . Marital Status: Married    Spouse Name: N/A  . Number of Children: N/A  . Years of Education: N/A   Occupational History  . CNA/CMA student    Social History Main Topics  . Smoking status: Never Smoker   . Smokeless tobacco: Not on file  . Alcohol Use: No  . Drug Use: No  . Sexual Activity: Not on file   Other Topics Concern  . Not on file   Social History Narrative   Married. Education: [employer].    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, studying medical assisting   Reports she has a good support system of friends, family, and classmates     Review of Systems: Constitutional: negative for chills, fever, night sweats, weight changes, or fatigue  HEENT: negative for vision changes, hearing loss, congestion, rhinorrhea, ST, epistaxis, or sinus pressure Cardiovascular: negative for chest pain or palpitations Respiratory: negative for hemoptysis, wheezing, shortness of breath, or cough Abdominal: negative for abdominal pain, nausea, vomiting, diarrhea, or constipation Dermatological: negative for rash Neurologic: negative for headache, dizziness, or syncope All other systems reviewed and are otherwise  negative with the exception to those above and in the HPI.   Physical Exam: Blood pressure 158/114, pulse 116, temperature 97.4 F (36.3 C), temperature source Oral, resp. rate 20, last menstrual period 12/18/2010, SpO2 99 %., There is no weight on file to calculate BMI. General: Well developed, well nourished, in no acute distress. Head: Normocephalic, atraumatic, eyes without discharge, sclera non-icteric, nares are without discharge. Bilateral auditory canals clear, TM's are without perforation, pearly grey  and translucent with reflective cone of light bilaterally. Oral cavity moist, posterior pharynx without exudate, erythema, peritonsillar abscess, or post nasal drip.  Neck: Supple. No thyromegaly. Full ROM. No lymphadenopathy. Lungs: Clear bilaterally to auscultation without wheezes, rales, or rhonchi. Breathing is unlabored. Heart: RRR with S1 S2. No murmurs, rubs, or gallops appreciated. Abdomen: Soft, non-tender, non-distended with normoactive bowel sounds. No hepatomegaly. No rebound/guarding. No obvious abdominal masses. Msk:  Strength and tone normal for age. No pain or ecchymosis anywhere on arm or hand.  Extremities/Skin: Warm and dry. No clubbing or cyanosis. No edema. No rashes or suspicious lesions.  Very tender trapezius without any erythema or ecchymosis anywhere on the right arm or neck. Neuro: Alert and oriented X 3. Moves all extremities spontaneously. Gait is normal. CNII-XII grossly in tact except for the right facial palsy. Right facial weakness from bells palsy.  Psych:  Responds to questions appropriately with a normal affect.     ASSESSMENT AND PLAN:  54 y.o. year old female with possible reaction to the flu shot This chart was scribed in my presence and reviewed by me personally.    ICD-9-CM ICD-10-CM   1. Right shoulder pain 719.41 M25.511 predniSONE (DELTASONE) 20 MG tablet     HYDROcodone-acetaminophen (NORCO) 5-325 MG tablet     Signed, Elvina SidleKurt Levin Dagostino, MD    Signed, Elvina SidleKurt Bruce Mayers, MD 04/30/2015 6:10 PM

## 2015-05-01 ENCOUNTER — Encounter: Payer: Self-pay | Admitting: Emergency Medicine

## 2015-05-01 ENCOUNTER — Telehealth: Payer: Self-pay | Admitting: Emergency Medicine

## 2015-05-01 NOTE — Telephone Encounter (Signed)
For pre-employment the patient is in need of a letter stating that despite her elevated blood pressure she is on medication and under the care of Dr. Dareen PianoAnderson for this condition. Pt is aware that she has to sign a medical release but will sign at time of pick up. She asks that the letter is available for pick up on 05/04/15.  Thank you,  [phone]

## 2015-05-01 NOTE — Telephone Encounter (Signed)
Done

## 2015-05-02 NOTE — Telephone Encounter (Signed)
Left message letter ready to pick up/ 

## 2015-08-02 ENCOUNTER — Other Ambulatory Visit: Payer: Self-pay

## 2015-08-02 MED ORDER — LOSARTAN POTASSIUM-HCTZ 50-12.5 MG PO TABS: 1.0000 | ORAL_TABLET | Freq: Every day | ORAL | Status: DC

## 2016-02-26 ENCOUNTER — Other Ambulatory Visit: Payer: Self-pay | Admitting: Internal Medicine

## 2016-02-26 DIAGNOSIS — Z1231 Encounter for screening mammogram for malignant neoplasm of breast: Secondary | ICD-10-CM

## 2016-02-29 ENCOUNTER — Ambulatory Visit
Admission: RE | Admit: 2016-02-29 | Discharge: 2016-02-29 | Disposition: A | Discharge status: Home / Self Care | Payer: 59 | Source: Ambulatory Visit | Attending: Internal Medicine | Admitting: Internal Medicine

## 2016-02-29 DIAGNOSIS — Z1231 Encounter for screening mammogram for malignant neoplasm of breast: Secondary | ICD-10-CM | POA: Diagnosis not present

## 2017-03-29 ENCOUNTER — Telehealth: Payer: 59 | Admitting: Family

## 2017-03-29 DIAGNOSIS — J208 Acute bronchitis due to other specified organisms: Secondary | ICD-10-CM | POA: Diagnosis not present

## 2017-03-29 MED ORDER — BENZONATATE 100 MG PO CAPS: 100.0000 mg | ORAL_CAPSULE | Freq: Three times a day (TID) | ORAL | 0 refills | Status: DC | PRN

## 2017-03-29 NOTE — Progress Notes (Signed)
We are sorry that you are not feeling well.  Here is how we plan to help!  Based on your presentation I believe you most likely have A cough due to a virus.  This is called viral bronchitis and is best treated by rest, plenty of fluids and control of the cough.  You may use Ibuprofen or Tylenol as directed to help your symptoms.     In addition you may use A non-prescription cough medication called Robitussin DAC. Take 2 teaspoons every 8 hours or Delsym: take 2 teaspoons every 12 hours., A non-prescription cough medication called Mucinex DM: take 2 tablets every 12 hours. and A prescription cough medication called Tessalon Perles 100mg . You may take 1-2 capsules every 8 hours as needed for your cough.  If you feel like you may have a urine infection, I will need you to complete the UTI questions. There are questions that need to be answered to see if this is something we can treat safely through an EVisit.   From your responses in the eVisit questionnaire you describe inflammation in the upper respiratory tract which is causing a significant cough.  This is commonly called Bronchitis and has four common causes:    Allergies  Viral Infections  Acid Reflux  Bacterial Infection Allergies, viruses and acid reflux are treated by controlling symptoms or eliminating the cause. An example might be a cough caused by taking certain blood pressure medications. You stop the cough by changing the medication. Another example might be a cough caused by acid reflux. Controlling the reflux helps control the cough.  USE OF BRONCHODILATOR ("RESCUE") INHALERS: There is a risk from using your bronchodilator too frequently.  The risk is that over-reliance on a medication which only relaxes the muscles surrounding the breathing tubes can reduce the effectiveness of medications prescribed to reduce swelling and congestion of the tubes themselves.  Although you feel brief relief from the bronchodilator inhaler, your  asthma may actually be worsening with the tubes becoming more swollen and filled with mucus.  This can delay other crucial treatments, such as oral steroid medications. If you need to use a bronchodilator inhaler daily, several times per day, you should discuss this with your provider.  There are probably better treatments that could be used to keep your asthma under control.     HOME CARE . Only take medications as instructed by your medical team. . Complete the entire course of an antibiotic. . Drink plenty of fluids and get plenty of rest. . Avoid close contacts especially the very young and the elderly . Cover your mouth if you cough or cough into your sleeve. . Always remember to wash your hands . A steam or ultrasonic humidifier can help congestion.   GET HELP RIGHT AWAY IF: . You develop worsening fever. . You become short of breath . You cough up blood. . Your symptoms persist after you have completed your treatment plan MAKE SURE YOU   Understand these instructions.  Will watch your condition.  Will get help right away if you are not doing well or get worse.  Your e-visit answers were reviewed by a board certified advanced clinical practitioner to complete your personal care plan.  Depending on the condition, your plan could have included both over the counter or prescription medications. If there is a problem please reply  once you have received a response from your provider. Your safety is important to us.  If you have drug allergies check your prescription  carefully.    You can use MyChart to ask questions about today's visit, request a non-urgent call back, or ask for a work or school excuse for 24 hours related to this e-Visit. If it has been greater than 24 hours you will need to follow up with your provider, or enter a new e-Visit to address those concerns. You will get an e-mail in the next two days asking about your experience.  I hope that your e-visit has been  valuable and will speed your recovery. Thank you for using e-visits.

## 2017-08-07 MED FILL — predniSONE 20 MG TABS: 20 | 6 days supply | Qty: 9 | Fill #0

## 2017-08-07 MED FILL — VENTOLIN HFA 90 MCG INHALER: 108 (90 BAS | 25 days supply | Qty: 18 | Fill #0

## 2017-08-08 ENCOUNTER — Encounter (HOSPITAL_COMMUNITY): Payer: Self-pay | Admitting: Emergency Medicine

## 2017-08-08 ENCOUNTER — Ambulatory Visit (HOSPITAL_COMMUNITY)
Admission: EM | Admit: 2017-08-08 | Discharge: 2017-08-08 | Disposition: A | Discharge status: Home / Self Care | Payer: 59 | Attending: Internal Medicine | Admitting: Internal Medicine

## 2017-08-08 DIAGNOSIS — J22 Unspecified acute lower respiratory infection: Secondary | ICD-10-CM | POA: Diagnosis not present

## 2017-08-08 DIAGNOSIS — I10 Essential (primary) hypertension: Secondary | ICD-10-CM

## 2017-08-08 LAB — POCT I-STAT, CHEM 8
BUN: 15 mg/dL (ref 6–20)
CALCIUM ION: 1.14 mmol/L — AB (ref 1.15–1.40)
CHLORIDE: 104 mmol/L (ref 101–111)
CREATININE: 0.6 mg/dL (ref 0.44–1.00)
Glucose, Bld: 102 mg/dL — ABNORMAL HIGH (ref 65–99)
HCT: 49 % — ABNORMAL HIGH (ref 36.0–46.0)
Hemoglobin: 16.7 g/dL — ABNORMAL HIGH (ref 12.0–15.0)
Potassium: 4.2 mmol/L (ref 3.5–5.1)
Sodium: 140 mmol/L (ref 135–145)
TCO2: 25 mmol/L (ref 22–32)

## 2017-08-08 MED ORDER — LOSARTAN POTASSIUM-HCTZ 50-12.5 MG PO TABS: 1.0000 | ORAL_TABLET | Freq: Every day | ORAL | 0 refills | Status: DC

## 2017-08-08 MED ORDER — AZITHROMYCIN 250 MG PO TABS: ORAL_TABLET | ORAL | 0 refills | Status: AC

## 2017-08-08 NOTE — ED Triage Notes (Signed)
Pt sts cough and some tingling around mouth; pt with hx of bells palsy x 6 years; pt also needs rx for htn meds

## 2017-08-08 NOTE — ED Provider Notes (Addendum)
[facility]    CSN: [id number] Arrival date & time: 08/08/17  1601     History   Chief Complaint Chief Complaint  Patient presents with  . Cough    HPI Tiffany Reynolds is a 57 y.o. female.   Tiffany Reynolds presents with complaints of two weeks of persistent cough. She started taking prednisone and with inhaler yesterday. This morning she felt that she had some neck swelling as well as tingling around her lips, which has since improved. History of bells palsy which she had tingling with. She states she has mild congestion which is normal for her. The cough is making her chest hurt but otherwise without chest pain. Denies headache or vision change. 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 She also states she is out of her BP medications and does not have a PCP. She had been taking hyzaar. Denies chest pain or palpitations. Hx allergies, anemia, bells palsy, depression, htn.    ROS per HPI.      Past Medical History:  Diagnosis Date  . Allergy   . Anemia   . Bell's palsy   . Blood transfusion without reported diagnosis   . Depression   . Hypertension     Patient Active Problem List   Diagnosis Date Noted  . Family history of malignant neoplasm of breast 10/21/2013    Past Surgical History:  Procedure Laterality Date  . CARPAL TUNNEL RELEASE    . CESAREAN SECTION    . TONSILLECTOMY      OB History   None      Home Medications    Prior to Admission medications   Medication Sig Start Date End Date Taking? Authorizing Provider  azithromycin (ZITHROMAX) 250 MG tablet Take 2 tablets (500 mg total) by mouth daily for 1 day, THEN 1 tablet (250 mg total) daily for 4 days. 08/08/17 08/13/17  Georgetta HaberBurky, Charbel Los B, NP  losartan-hydrochlorothiazide (HYZAAR) 50-12.5 MG tablet Take 1 tablet by mouth daily. 08/02/15   Garnetta BuddyEnglish, Stephanie D, PA  predniSONE (DELTASONE) 20 MG tablet Two daily with food 04/30/15   Elvina SidleLauenstein, Kurt, MD     Family History Family History  Problem Relation Age of Onset  . Cancer Mother 2550       breast  . Hyperlipidemia Father   . Cancer Maternal Grandmother 3868       breast  . Cancer Paternal Aunt 6675       breast    Social History Social History   Tobacco Use  . Smoking status: Never Smoker  Substance Use Topics  . Alcohol use: No  . Drug use: No     Allergies   Anaprox [naproxen sodium]; Tetracyclines & related; and Vicodin [hydrocodone-acetaminophen]   Review of Systems Review of Systems   Physical Exam Triage Vital Signs ED Triage Vitals [08/08/17 1652]  Enc Vitals Group     BP (!) 201/107     Pulse Rate 89     Resp 18     Temp 98.3 F (36.8 C)     Temp Source Oral     SpO2 100 %     Weight      Height      Head Circumference      Peak Flow      Pain Score      Pain Loc      Pain Edu?      Excl. in GC?  No data found.  Updated Vital Signs BP (!) 201/107 (BP Location: Left Arm)   Pulse 89   Temp 98.3 F (36.8 C) (Oral)   Resp 18   LMP 12/18/2010   SpO2 100%   Visual Acuity Right Eye Distance:   Left Eye Distance:   Bilateral Distance:    Right Eye Near:   Left Eye Near:    Bilateral Near:     Physical Exam  Constitutional: She is oriented to person, place, and time. She appears well-developed and well-nourished. No distress.  HENT:  Head: Normocephalic and atraumatic.  Right Ear: Tympanic membrane, external ear and ear canal normal.  Left Ear: Tympanic membrane, external ear and ear canal normal.  Nose: Nose normal.  Mouth/Throat: Uvula is midline, oropharynx is clear and moist and mucous membranes are normal. No tonsillar exudate.  Baseline right sided facial droop noted   Eyes: Pupils are equal, round, and reactive to light. Conjunctivae and EOM are normal.  Cardiovascular: Normal rate, regular rhythm and normal heart sounds.  Pulmonary/Chest: Effort normal and breath sounds normal. No respiratory distress.  Without cough  throughout exam   Lymphadenopathy:    She has no cervical adenopathy.  Neurological: She is alert and oriented to person, place, and time.  Skin: Skin is warm and dry.     UC Treatments / Results  Labs (all labs ordered are listed, but only abnormal results are displayed) Labs Reviewed - No data to display  EKG None Radiology No results found.  Procedures Procedures (including critical care time)  Medications Ordered in UC Medications - No data to display   Initial Impression / Assessment and Plan / UC Course  I have reviewed the triage vital signs and the nursing notes.  Pertinent labs & imaging results that were available during my care of the patient were reviewed by me and considered in my medical decision making (see chart for details).     Non toxic in appearance. Afebrile. Without tachycardia or hypoxia. 2 weeks of persistent cough, mild improvement with prednisone. Will provide coverage with azithromycin as well. Patient has taken this before. Without any indications of allergic response to prednisone at this time. Without swelling or difficulty swallowing. No wheezing present. Chem 8 unremarkable. bp RX refilled. Encouraged patient to establish and follow with a PCP for recheck. Patient verbalized understanding and agreeable to plan.    Final Clinical Impressions(s) / UC Diagnoses   Final diagnoses:  Lower respiratory tract infection  Essential hypertension    ED Discharge Orders        Ordered    azithromycin (ZITHROMAX) 250 MG tablet     08/08/17 1716       Controlled Substance Prescriptions [place] Controlled Substance Registry consulted? Not Applicable       Georgetta Haber, NP 08/08/17 1722

## 2017-08-08 NOTE — Discharge Instructions (Addendum)
Push fluids to ensure adequate hydration and keep secretions thin.  Tylenol and/or ibuprofen as needed for pain or fevers.   Complete course of antibiotics.   Complete provided prednisone. Please follow up with a primary care provider for recheck of your blood pressure in the next 1-2 weeks.

## 2017-08-13 MED FILL — LOSARTAN-HCTZ 50-12.5 MG TA: 50-12.5 | 30 days supply | Qty: 30 | Fill #0

## 2017-08-19 MED FILL — MONTELUKAST SOD 10 MG TAB: 10 | 30 days supply | Qty: 30 | Fill #0

## 2017-08-21 ENCOUNTER — Ambulatory Visit (INDEPENDENT_AMBULATORY_CARE_PROVIDER_SITE_OTHER): Payer: Self-pay

## 2017-08-21 ENCOUNTER — Ambulatory Visit (INDEPENDENT_AMBULATORY_CARE_PROVIDER_SITE_OTHER): Payer: Self-pay | Admitting: Physician Assistant

## 2017-08-21 ENCOUNTER — Other Ambulatory Visit: Payer: Self-pay

## 2017-08-21 ENCOUNTER — Encounter: Payer: Self-pay | Admitting: Physician Assistant

## 2017-08-21 VITALS — BP 132/88 | HR 113 | Temp 98.3°F | Resp 16 | Ht 59.0 in | Wt 268.4 lb

## 2017-08-21 DIAGNOSIS — R05 Cough: Secondary | ICD-10-CM

## 2017-08-21 DIAGNOSIS — G510 Bell's palsy: Secondary | ICD-10-CM | POA: Insufficient documentation

## 2017-08-21 DIAGNOSIS — R059 Cough, unspecified: Secondary | ICD-10-CM | POA: Insufficient documentation

## 2017-08-21 MED ORDER — RANITIDINE HCL 150 MG PO CAPS: 150.0000 mg | ORAL_CAPSULE | Freq: Two times a day (BID) | ORAL | 2 refills | Status: DC

## 2017-08-21 MED FILL — raNITIdine HCL 150 MG TABS: 150 | 30 days supply | Qty: 60 | Fill #0

## 2017-08-21 NOTE — Progress Notes (Deleted)
   Subjective:    Patient ID: Tiffany Reynolds, female    DOB: 07/23/1960, 57 y.o.   MRN: [id number] Chief Complaint  Patient presents with  . URI    cough x 4 weeks x clear to yellow phlegm     HPI    Review of Systems  Patient Active Problem List   Diagnosis Date Noted  . Family history of malignant neoplasm of breast 10/21/2013    Past Medical History:  Diagnosis Date  . Allergy   . Anemia   . Bell's palsy   . Blood transfusion without reported diagnosis   . Depression   . Hypertension     Prior to Admission medications   Medication Sig Start Date End Date Taking? Authorizing Provider  losartan-hydrochlorothiazide (HYZAAR) 50-12.5 MG tablet Take 1 tablet by mouth daily. 08/08/17   Georgetta HaberBurky, Natalie B, NP  montelukast (SINGULAIR) 10 MG tablet Take 10 mg by mouth daily. 08/19/17   [provider]  predniSONE (DELTASONE) 20 MG tablet Two daily with food Patient not taking: Reported on 08/21/2017 04/30/15   Elvina SidleLauenstein, Kurt, MD    Allergies  Allergen Reactions  . Anaprox [Naproxen Sodium]   . Tetracyclines & Related     Yeast infection   . Vicodin [Hydrocodone-Acetaminophen]        Objective:   Physical Exam  Constitutional:  BP 132/88   Pulse (!) 113   Temp 98.3 F (36.8 C)   Resp 16   Ht 4\' 11"  (1.499 m)   Wt 268 lb 6.4 oz (121.7 kg)   LMP 12/18/2010   SpO2 96%   BMI 54.21 kg/m            Assessment & Plan:

## 2017-08-21 NOTE — Assessment & Plan Note (Addendum)
Unclear etiology. Initially thought due to allergy/irritant to mold in work environment, but no improvement with oral prednisone. Albuterol worsened wheezing. No improvement with azithromycin, tessalon perles. Normal lung exam x 2. Normal spirometry here today, but reduced peak flow. ?Reflux? Trial of ranitidine.  Await CXR. Anticipate referral to pulmonology.

## 2017-08-21 NOTE — Progress Notes (Signed)
Patient ID: Tiffany Reynolds, female    DOB: 11/28/1960, 57 y.o.   MRN: [id number]  PCP: Patient, No Pcp Per  Chief Complaint  Patient presents with  . URI    cough x 4 weeks x clear to yellow phlegm     Subjective:   Presents for evaluation of cough and wheezing ("musical rhonchi").  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 She has been experiencing intermittent respiratory illness for a number of months now.  The PCCM physician she works with has treated her with oral prednisone and albuterol inhaler, and now recommends a CXR. No change in symptoms with prednisone. The albuterol was associated with increased wheezing. The physician added montelukast on 08/17/2017.  [facility] and was prescribed azithromycin and tessalon perles 08/08/2017, for lower respiratory infection which have not alleviated her symptoms. Cough progresses throughout the day and is associated with stress incontinence.  No nasal/sinus symptoms. No ear pressure/pain.  Known GERD, though generally well controlled. Sometimes has water brash, and eliminates coffee consumption temporarily with results. Father had alzheimer's, so she does not want to take a PPI. No difficulty swallowing. Notes that sometimes the cough feels more like laryngospasm than bronchospasm.   Review of Systems As above. Watery RIGHT eye and RIGHT facial droop is her baseline since Bell's Palsy in 2009.    Patient Active Problem List   Diagnosis Date Noted  . Facial paralysis/Bells palsy 08/21/2017  . Cough 08/21/2017  . Family history of malignant neoplasm of breast 10/21/2013     Prior to Admission medications   Medication Sig Start Date End Date Taking? Authorizing Provider  losartan-hydrochlorothiazide (HYZAAR) 50-12.5 MG tablet Take 1 tablet by mouth daily. 08/08/17   Georgetta HaberBurky, Tiffany B, NP  montelukast (SINGULAIR) 10 MG tablet Take 10 mg by mouth daily. 08/19/17   [provider]      Allergies  Allergen Reactions  . Anaprox [Naproxen Sodium]   . Tetracyclines & Related     Yeast infection   . Vicodin [Hydrocodone-Acetaminophen]        Objective:  Physical Exam  Constitutional: She is oriented to person, place, and time. She appears well-developed and well-nourished. She is active and cooperative. No distress.  BP 132/88   Pulse (!) 113   Temp 98.3 F (36.8 C)   Resp 16   Ht 4\' 11"  (1.499 m)   Wt 268 lb 6.4 oz (121.7 kg)   LMP 12/18/2010   SpO2 96%   PF 270 L/min   BMI 54.21 kg/m   HENT:  Head: Normocephalic and atraumatic.  Right Ear: Hearing, tympanic membrane, external ear and ear canal normal.  Left Ear: Hearing, tympanic membrane, external ear and ear canal normal.  Eyes: Pupils are equal, round, and reactive to light. Conjunctivae and EOM are normal. No scleral icterus.  Neck: Trachea normal, normal range of motion and phonation normal. Neck supple. No thyromegaly present.  Cardiovascular: Normal rate, regular rhythm and normal heart sounds.  Pulses:      Radial pulses are 2+ on the right side, and 2+ on the left side.  Pulmonary/Chest: Effort normal and breath sounds normal.  Lymphadenopathy:       Head (right side): No tonsillar, no preauricular, no posterior auricular and no occipital adenopathy present.       Head (left side): No tonsillar, no preauricular, no posterior auricular and no occipital adenopathy present.    She has no cervical adenopathy.  Right: No supraclavicular adenopathy present.       Left: No supraclavicular adenopathy present.  Neurological: She is alert and oriented to person, place, and time. No sensory deficit.  RIGHT sided facial droop is baseline, due to Bell's Palsy  Skin: Skin is warm, dry and intact. Capillary refill takes less than 2 seconds. No rash noted. No cyanosis or erythema. Nails show no clubbing.  Psychiatric: She has a normal mood and affect. Her speech is normal and behavior is normal.  Judgment and thought content normal. Cognition and memory are normal.   Office Spirometry Results: Normal Peak Flow: 270 L/min FEV1: 1.88 liters FVC: 2.38 liters FEV1/FVC: 79 % FVC  % Predicted: 86 % FEV % Predicted: 87 % FeF 25-75: 1.8 liters FeF 25-75 % Predicted: 83   Peak flow reading is 270, about 70 % of predicted (386).       Assessment & Plan:   Problem List Items Addressed This Visit    Cough - Primary    Unclear etiology. Initially thought due to allergy/irritant to mold in work environment, but no improvement with oral prednisone. Albuterol worsened wheezing. No improvement with azithromycin, tessalon perles. Normal lung exam x 2. Normal spirometry here today, but reduced peak flow. ?Reflux? Trial of ranitidine.  Await CXR. Anticipate referral to pulmonology.      Relevant Medications   ranitidine (ZANTAC) 150 MG capsule   Other Relevant Orders   Care order/instruction: (Completed)   Check Peak Flow   DG Chest 2 View (Completed)   Care order/instruction: (Completed)       Return for re-evalaution pending CXR results.   Fernande Bras, PA-C [facility]

## 2017-08-21 NOTE — Patient Instructions (Signed)
     IF you received an x-ray today, you will receive an invoice from [facility]. Please contact [facility] at [phone] with questions or concerns regarding your invoice.   IF you received labwork today, you will receive an invoice from [facility]. Please contact [facility] at [phone] with questions or concerns regarding your invoice.   Our billing staff will not be able to assist you with questions regarding bills from these companies.  You will be contacted with the lab results as soon as they are available. The fastest way to get your results is to activate your My Chart account. Instructions are located on the last page of this paperwork. If you have not heard from us regarding the results in 2 weeks, please contact this office.     

## 2017-08-22 ENCOUNTER — Other Ambulatory Visit: Payer: Self-pay | Admitting: Physician Assistant

## 2017-08-22 DIAGNOSIS — R05 Cough: Secondary | ICD-10-CM

## 2017-08-22 DIAGNOSIS — R059 Cough, unspecified: Secondary | ICD-10-CM

## 2017-08-22 NOTE — Progress Notes (Signed)
CXR shows no cause for patient's persistent cough. As planned, refer to pulmonology.  Orders Placed This Encounter  Procedures  . Ambulatory referral to Pulmonology    Referral Priority:   Routine    Referral Type:   Consultation    Referral Reason:   Specialty Services Required    Requested Specialty:   Pulmonary Disease    Number of Visits Requested:   1

## 2017-10-26 DIAGNOSIS — H524 Presbyopia: Secondary | ICD-10-CM | POA: Diagnosis not present

## 2017-10-29 MED FILL — raNITIdine HCL 150 MG TABS: 150 | 30 days supply | Qty: 60 | Fill #1

## 2017-10-31 ENCOUNTER — Other Ambulatory Visit: Payer: Self-pay

## 2017-10-31 ENCOUNTER — Encounter: Payer: Self-pay | Admitting: Urgent Care

## 2017-10-31 ENCOUNTER — Ambulatory Visit (INDEPENDENT_AMBULATORY_CARE_PROVIDER_SITE_OTHER): Payer: 59 | Admitting: Urgent Care

## 2017-10-31 VITALS — BP 135/86 | HR 92 | Temp 98.1°F | Resp 16 | Ht 60.75 in | Wt 270.0 lb

## 2017-10-31 DIAGNOSIS — R7989 Other specified abnormal findings of blood chemistry: Secondary | ICD-10-CM | POA: Diagnosis not present

## 2017-10-31 DIAGNOSIS — Z1321 Encounter for screening for nutritional disorder: Secondary | ICD-10-CM | POA: Diagnosis not present

## 2017-10-31 DIAGNOSIS — I10 Essential (primary) hypertension: Secondary | ICD-10-CM

## 2017-10-31 DIAGNOSIS — Z13228 Encounter for screening for other metabolic disorders: Secondary | ICD-10-CM

## 2017-10-31 DIAGNOSIS — G510 Bell's palsy: Secondary | ICD-10-CM | POA: Diagnosis not present

## 2017-10-31 DIAGNOSIS — E559 Vitamin D deficiency, unspecified: Secondary | ICD-10-CM | POA: Diagnosis not present

## 2017-10-31 DIAGNOSIS — Z1159 Encounter for screening for other viral diseases: Secondary | ICD-10-CM | POA: Diagnosis not present

## 2017-10-31 DIAGNOSIS — Z1329 Encounter for screening for other suspected endocrine disorder: Secondary | ICD-10-CM

## 2017-10-31 DIAGNOSIS — Z1211 Encounter for screening for malignant neoplasm of colon: Secondary | ICD-10-CM | POA: Diagnosis not present

## 2017-10-31 DIAGNOSIS — Z803 Family history of malignant neoplasm of breast: Secondary | ICD-10-CM

## 2017-10-31 DIAGNOSIS — Z0000 Encounter for general adult medical examination without abnormal findings: Secondary | ICD-10-CM | POA: Diagnosis not present

## 2017-10-31 DIAGNOSIS — Z114 Encounter for screening for human immunodeficiency virus [HIV]: Secondary | ICD-10-CM | POA: Diagnosis not present

## 2017-10-31 DIAGNOSIS — Z130 Encounter for screening for diseases of the blood and blood-forming organs and certain disorders involving the immune mechanism: Secondary | ICD-10-CM

## 2017-10-31 MED ORDER — LOSARTAN POTASSIUM-HCTZ 50-12.5 MG PO TABS: 1.0000 | ORAL_TABLET | Freq: Every day | ORAL | 3 refills | Status: DC

## 2017-10-31 NOTE — Patient Instructions (Addendum)
Health Maintenance, Female Adopting a healthy lifestyle and getting preventive care can go a long way to promote health and wellness. Talk with your health care provider about what schedule of regular examinations is right for you. This is a good chance for you to check in with your provider about disease prevention and staying healthy. In between checkups, there are plenty of things you can do on your own. Experts have done a lot of research about which lifestyle changes and preventive measures are most likely to keep you healthy. Ask your health care provider for more information. Weight and diet Eat a healthy diet  Be sure to include plenty of vegetables, fruits, low-fat dairy products, and lean protein.  Do not eat a lot of foods high in solid fats, added sugars, or salt.  Get regular exercise. This is one of the most important things you can do for your health. ? Most adults should exercise for at least 150 minutes each week. The exercise should increase your heart rate and make you sweat (moderate-intensity exercise). ? Most adults should also do strengthening exercises at least twice a week. This is in addition to the moderate-intensity exercise.  Maintain a healthy weight  Body mass index (BMI) is a measurement that can be used to identify possible weight problems. It estimates body fat based on height and weight. Your health care provider can help determine your BMI and help you achieve or maintain a healthy weight.  For females 20 years of age and older: ? A BMI below 18.5 is considered underweight. ? A BMI of 18.5 to 24.9 is normal. ? A BMI of 25 to 29.9 is considered overweight. ? A BMI of 30 and above is considered obese.  Watch levels of cholesterol and blood lipids  You should start having your blood tested for lipids and cholesterol at 59 years of age, then have this test every 5 years.  You may need to have your cholesterol levels checked more often if: ? Your lipid or  cholesterol levels are high. ? You are older than 59 years of age. ? You are at high risk for heart disease.  Cancer screening Lung Cancer  Lung cancer screening is recommended for adults 55-80 years old who are at high risk for lung cancer because of a history of smoking.  A yearly low-dose CT scan of the lungs is recommended for people who: ? Currently smoke. ? Have quit within the past 15 years. ? Have at least a 30-pack-year history of smoking. A pack year is smoking an average of one pack of cigarettes a day for 1 year.  Yearly screening should continue until it has been 15 years since you quit.  Yearly screening should stop if you develop a health problem that would prevent you from having lung cancer treatment.  Breast Cancer  Practice breast self-awareness. This means understanding how your breasts normally appear and feel.  It also means doing regular breast self-exams. Let your health care provider know about any changes, no matter how small.  If you are in your 20s or 30s, you should have a clinical breast exam (CBE) by a health care provider every 1-3 years as part of a regular health exam.  If you are 40 or older, have a CBE every year. Also consider having a breast X-ray (mammogram) every year.  If you have a family history of breast cancer, talk to your health care provider about genetic screening.  If you are at high risk   for breast cancer, talk to your health care provider about having an MRI and a mammogram every year.  Breast cancer gene (BRCA) assessment is recommended for women who have family members with BRCA-related cancers. BRCA-related cancers include: ? Breast. ? Ovarian. ? Tubal. ? Peritoneal cancers.  Results of the assessment will determine the need for genetic counseling and BRCA1 and BRCA2 testing.  Cervical Cancer Your health care provider may recommend that you be screened regularly for cancer of the pelvic organs (ovaries, uterus, and  vagina). This screening involves a pelvic examination, including checking for microscopic changes to the surface of your cervix (Pap test). You may be encouraged to have this screening done every 3 years, beginning at age 22.  For women ages 56-65, health care providers may recommend pelvic exams and Pap testing every 3 years, or they may recommend the Pap and pelvic exam, combined with testing for human papilloma virus (HPV), every 5 years. Some types of HPV increase your risk of cervical cancer. Testing for HPV may also be done on women of any age with unclear Pap test results.  Other health care providers may not recommend any screening for nonpregnant women who are considered low risk for pelvic cancer and who do not have symptoms. Ask your health care provider if a screening pelvic exam is right for you.  If you have had past treatment for cervical cancer or a condition that could lead to cancer, you need Pap tests and screening for cancer for at least 20 years after your treatment. If Pap tests have been discontinued, your risk factors (such as having a new sexual partner) need to be reassessed to determine if screening should resume. Some women have medical problems that increase the chance of getting cervical cancer. In these cases, your health care provider may recommend more frequent screening and Pap tests.  Colorectal Cancer  This type of cancer can be detected and often prevented.  Routine colorectal cancer screening usually begins at 59 years of age and continues through 59 years of age.  Your health care provider may recommend screening at an earlier age if you have risk factors for colon cancer.  Your health care provider may also recommend using home test kits to check for hidden blood in the stool.  A small camera at the end of a tube can be used to examine your colon directly (sigmoidoscopy or colonoscopy). This is done to check for the earliest forms of colorectal  cancer.  Routine screening usually begins at age 33.  Direct examination of the colon should be repeated every 5-10 years through 59 years of age. However, you may need to be screened more often if early forms of precancerous polyps or small growths are found.  Skin Cancer  Check your skin from head to toe regularly.  Tell your health care provider about any new moles or changes in moles, especially if there is a change in a mole's shape or color.  Also tell your health care provider if you have a mole that is larger than the size of a pencil eraser.  Always use sunscreen. Apply sunscreen liberally and repeatedly throughout the day.  Protect yourself by wearing long sleeves, pants, a wide-brimmed hat, and sunglasses whenever you are outside.  Heart disease, diabetes, and high blood pressure  High blood pressure causes heart disease and increases the risk of stroke. High blood pressure is more likely to develop in: ? People who have blood pressure in the high end of  the normal range (130-139/85-89 mm Hg). ? People who are overweight or obese. ? People who are African American.  If you are 21-29 years of age, have your blood pressure checked every 3-5 years. If you are 3 years of age or older, have your blood pressure checked every year. You should have your blood pressure measured twice-once when you are at a hospital or clinic, and once when you are not at a hospital or clinic. Record the average of the two measurements. To check your blood pressure when you are not at a hospital or clinic, you can use: ? An automated blood pressure machine at a pharmacy. ? A home blood pressure monitor.  If you are between 17 years and 37 years old, ask your health care provider if you should take aspirin to prevent strokes.  Have regular diabetes screenings. This involves taking a blood sample to check your fasting blood sugar level. ? If you are at a normal weight and have a low risk for diabetes,  have this test once every three years after 59 years of age. ? If you are overweight and have a high risk for diabetes, consider being tested at a younger age or more often. Preventing infection Hepatitis B  If you have a higher risk for hepatitis B, you should be screened for this virus. You are considered at high risk for hepatitis B if: ? You were born in a country where hepatitis B is common. Ask your health care provider which countries are considered high risk. ? Your parents were born in a high-risk country, and you have not been immunized against hepatitis B (hepatitis B vaccine). ? You have HIV or AIDS. ? You use needles to inject street drugs. ? You live with someone who has hepatitis B. ? You have had sex with someone who has hepatitis B. ? You get hemodialysis treatment. ? You take certain medicines for conditions, including cancer, organ transplantation, and autoimmune conditions.  Hepatitis C  Blood testing is recommended for: ? Everyone born from [birth year] through 1967. ? Anyone with known risk factors for hepatitis C.  Sexually transmitted infections (STIs)  You should be screened for sexually transmitted infections (STIs) including gonorrhea and chlamydia if: ? You are sexually active and are younger than 59 years of age. ? You are older than 59 years of age and your health care provider tells you that you are at risk for this type of infection. ? Your sexual activity has changed since you were last screened and you are at an increased risk for chlamydia or gonorrhea. Ask your health care provider if you are at risk.  If you do not have HIV, but are at risk, it may be recommended that you take a prescription medicine daily to prevent HIV infection. This is called pre-exposure prophylaxis (PrEP). You are considered at risk if: ? You are sexually active and do not regularly use condoms or know the HIV status of your partner(s). ? You take drugs by injection. ? You are  sexually active with a partner who has HIV.  Talk with your health care provider about whether you are at high risk of being infected with HIV. If you choose to begin PrEP, you should first be tested for HIV. You should then be tested every 3 months for as long as you are taking PrEP. Pregnancy  If you are premenopausal and you may become pregnant, ask your health care provider about preconception counseling.  If you may become  pregnant, take 400 to 800 micrograms (mcg) of folic acid every day.  If you want to prevent pregnancy, talk to your health care provider about birth control (contraception). Osteoporosis and menopause  Osteoporosis is a disease in which the bones lose minerals and strength with aging. This can result in serious bone fractures. Your risk for osteoporosis can be identified using a bone density scan.  If you are 37 years of age or older, or if you are at risk for osteoporosis and fractures, ask your health care provider if you should be screened.  Ask your health care provider whether you should take a calcium or vitamin D supplement to lower your risk for osteoporosis.  Menopause may have certain physical symptoms and risks.  Hormone replacement therapy may reduce some of these symptoms and risks. Talk to your health care provider about whether hormone replacement therapy is right for you. Follow these instructions at home:  Schedule regular health, dental, and eye exams.  Stay current with your immunizations.  Do not use any tobacco products including cigarettes, chewing tobacco, or electronic cigarettes.  If you are pregnant, do not drink alcohol.  If you are breastfeeding, limit how much and how often you drink alcohol.  Limit alcohol intake to no more than 1 drink per day for nonpregnant women. One drink equals 12 ounces of beer, 5 ounces of wine, or 1 ounces of hard liquor.  Do not use street drugs.  Do not share needles.  Ask your health care  provider for help if you need support or information about quitting drugs.  Tell your health care provider if you often feel depressed.  Tell your health care provider if you have ever been abused or do not feel safe at home. This information is not intended to replace advice given to you by your health care provider. Make sure you discuss any questions you have with your health care provider. Document Released: 11/04/2010 Document Revised: 09/27/2015 Document Reviewed: 01/23/2015 Elsevier Interactive Patient Education  2018 [employer].    Hypertension Hypertension, commonly called high blood pressure, is when the force of blood pumping through the arteries is too strong. The arteries are the blood vessels that carry blood from the heart throughout the body. Hypertension forces the heart to work harder to pump blood and may cause arteries to become narrow or stiff. Having untreated or uncontrolled hypertension can cause heart attacks, strokes, kidney disease, and other problems. A blood pressure reading consists of a higher number over a lower number. Ideally, your blood pressure should be below 120/80. The first ("top") number is called the systolic pressure. It is a measure of the pressure in your arteries as your heart beats. The second ("bottom") number is called the diastolic pressure. It is a measure of the pressure in your arteries as the heart relaxes. What are the causes? The cause of this condition is not known. What increases the risk? Some risk factors for high blood pressure are under your control. Others are not. Factors you can change  Smoking.  Having type 2 diabetes mellitus, high cholesterol, or both.  Not getting enough exercise or physical activity.  Being overweight.  Having too much fat, sugar, calories, or salt (sodium) in your diet.  Drinking too much alcohol. Factors that are difficult or impossible to change  Having chronic kidney disease.  Having a  family history of high blood pressure.  Age. Risk increases with age.  Race. You may be at higher risk if you  are African-American.  Gender. Men are at higher risk than women before age 18. After age 96, women are at higher risk than men.  Having obstructive sleep apnea.  Stress. What are the signs or symptoms? Extremely high blood pressure (hypertensive crisis) may cause:  Headache.  Anxiety.  Shortness of breath.  Nosebleed.  Nausea and vomiting.  Severe chest pain.  Jerky movements you cannot control (seizures).  How is this diagnosed? This condition is diagnosed by measuring your blood pressure while you are seated, with your arm resting on a surface. The cuff of the blood pressure monitor will be placed directly against the skin of your upper arm at the level of your heart. It should be measured at least twice using the same arm. Certain conditions can cause a difference in blood pressure between your right and left arms. Certain factors can cause blood pressure readings to be lower or higher than normal (elevated) for a short period of time:  When your blood pressure is higher when you are in a health care provider's office than when you are at home, this is called white coat hypertension. Most people with this condition do not need medicines.  When your blood pressure is higher at home than when you are in a health care provider's office, this is called masked hypertension. Most people with this condition may need medicines to control blood pressure.  If you have a high blood pressure reading during one visit or you have normal blood pressure with other risk factors:  You may be asked to return on a different day to have your blood pressure checked again.  You may be asked to monitor your blood pressure at home for 1 week or longer.  If you are diagnosed with hypertension, you may have other blood or imaging tests to help your health care provider understand your  overall risk for other conditions. How is this treated? This condition is treated by making healthy lifestyle changes, such as eating healthy foods, exercising more, and reducing your alcohol intake. Your health care provider may prescribe medicine if lifestyle changes are not enough to get your blood pressure under control, and if:  Your systolic blood pressure is above 130.  Your diastolic blood pressure is above 80.  Your personal target blood pressure may vary depending on your medical conditions, your age, and other factors. Follow these instructions at home: Eating and drinking  Eat a diet that is high in fiber and potassium, and low in sodium, added sugar, and fat. An example eating plan is called the DASH (Dietary Approaches to Stop Hypertension) diet. To eat this way: ? Eat plenty of fresh fruits and vegetables. Try to fill half of your plate at each meal with fruits and vegetables. ? Eat whole grains, such as whole wheat pasta, brown rice, or whole grain bread. Fill about one quarter of your plate with whole grains. ? Eat or drink low-fat dairy products, such as skim milk or low-fat yogurt. ? Avoid fatty cuts of meat, processed or cured meats, and poultry with skin. Fill about one quarter of your plate with lean proteins, such as fish, chicken without skin, beans, eggs, and tofu. ? Avoid premade and processed foods. These tend to be higher in sodium, added sugar, and fat.  Reduce your daily sodium intake. Most people with hypertension should eat less than 1,500 mg of sodium a day.  Limit alcohol intake to no more than 1 drink a day for nonpregnant women and 2  drinks a day for men. One drink equals 12 oz of beer, 5 oz of wine, or 1 oz of hard liquor. Lifestyle  Work with your health care provider to maintain a healthy body weight or to lose weight. Ask what an ideal weight is for you.  Get at least 30 minutes of exercise that causes your heart to beat faster (aerobic exercise)  most days of the week. Activities may include walking, swimming, or biking.  Include exercise to strengthen your muscles (resistance exercise), such as pilates or lifting weights, as part of your weekly exercise routine. Try to do these types of exercises for 30 minutes at least 3 days a week.  Do not use any products that contain nicotine or tobacco, such as cigarettes and e-cigarettes. If you need help quitting, ask your health care provider.  Monitor your blood pressure at home as told by your health care provider.  Keep all follow-up visits as told by your health care provider. This is important. Medicines  Take over-the-counter and prescription medicines only as told by your health care provider. Follow directions carefully. Blood pressure medicines must be taken as prescribed.  Do not skip doses of blood pressure medicine. Doing this puts you at risk for problems and can make the medicine less effective.  Ask your health care provider about side effects or reactions to medicines that you should watch for. Contact a health care provider if:  You think you are having a reaction to a medicine you are taking.  You have headaches that keep coming back (recurring).  You feel dizzy.  You have swelling in your ankles.  You have trouble with your vision. Get help right away if:  You develop a severe headache or confusion.  You have unusual weakness or numbness.  You feel faint.  You have severe pain in your chest or abdomen.  You vomit repeatedly.  You have trouble breathing. Summary  Hypertension is when the force of blood pumping through your arteries is too strong. If this condition is not controlled, it may put you at risk for serious complications.  Your personal target blood pressure may vary depending on your medical conditions, your age, and other factors. For most people, a normal blood pressure is less than 120/80.  Hypertension is treated with lifestyle changes,  medicines, or a combination of both. Lifestyle changes include weight loss, eating a healthy, low-sodium diet, exercising more, and limiting alcohol. This information is not intended to replace advice given to you by your health care provider. Make sure you discuss any questions you have with your health care provider. Document Released: 04/21/2005 Document Revised: 03/19/2016 Document Reviewed: 03/19/2016 Elsevier Interactive Patient Education  2018 [employer].    IF you received an x-ray today, you will receive an invoice from [facility]. Please contact [facility] at [phone] with questions or concerns regarding your invoice.   IF you received labwork today, you will receive an invoice from [facility]. Please contact [facility] at [phone] with questions or concerns regarding your invoice.   Our billing staff will not be able to assist you with questions regarding bills from these companies.  You will be contacted with the lab results as soon as they are available. The fastest way to get your results is to activate your My Chart account. Instructions are located on the last page of this paperwork. If you have not heard from [place] regarding the results in 2 weeks, please contact this office.

## 2017-10-31 NOTE — Progress Notes (Signed)
MRN: [id number]  Subjective:   Tiffany Reynolds is a 57 y.o. female presenting for annual physical exam. Works as a Engineer, site. Is happily married, has 2 sons. Has good relationships at home, has a good support network. Denies smoking cigarettes, drinking alcohol. Donates blood regularly, has been tested for HIV, hepatitis.   Medical care team includes: PCP: Patient, No Pcp Per Vision: Last eye exam was 1 week ago. Wears glasses.  Dental: Cleanings once every 6 months. OB/GYN: Does not have a gynecologist. Last pap smear was ~1.5 years ago, was normal. Has a family history of breast cancer. Last mammogram was ~1.5 years ago, was normal.  Specialists: None.  Tiffany Reynolds has a current medication list which includes the following prescription(s): calcium carbonate-vitamin d, losartan-hydrochlorothiazide, and ranitidine. She is allergic to albuterol; amlodipine; naproxen sodium; tetracyclines & related; and vicodin [hydrocodone-acetaminophen]. Tiffany Reynolds  has a past medical history of Allergy, Anemia, Bell's palsy, Blood transfusion without reported diagnosis, Depression, and Hypertension. Also  has a past surgical history that includes Cesarean section; Carpal tunnel release; and Tonsillectomy. Her  family history includes Cancer (age of onset: 39) in her mother; Cancer (age of onset: 17) in her maternal grandmother; Cancer (age of onset: 29) in her paternal aunt; Hyperlipidemia in her father.  Review of Systems  Constitutional: Negative for chills, diaphoresis, fever, malaise/fatigue and weight loss.  HENT: Negative for congestion, ear discharge, ear pain, hearing loss, nosebleeds, sore throat and tinnitus.   Eyes: Negative for blurred vision, double vision, photophobia, pain, discharge and redness.  Respiratory: Negative for cough, shortness of breath and wheezing.   Cardiovascular: Negative for chest pain, palpitations and leg swelling.  Gastrointestinal: Negative for abdominal pain, blood  in stool, constipation, diarrhea, nausea and vomiting.  Genitourinary: Negative for dysuria, flank pain, frequency, hematuria and urgency.  Musculoskeletal: Negative for back pain, joint pain and myalgias.  Skin: Negative for itching and rash.  Neurological: Negative for dizziness, tingling, seizures, loss of consciousness, weakness and headaches.       Right-sided facial droop, history of Bell's palsy since 2014.  Endo/Heme/Allergies: Negative for polydipsia.  Psychiatric/Behavioral: Negative for depression, hallucinations, memory loss, substance abuse and suicidal ideas. The patient is not nervous/anxious and does not have insomnia.    Objective:   Vitals: BP 135/86 (BP Location: Right Arm)   Pulse 92   Temp 98.1 F (36.7 C) (Oral)   Resp 16   Ht 5' 0.75" (1.543 m)   Wt 270 lb (122.5 kg)   LMP 10/20/2017   SpO2 98%   BMI 51.44 kg/m   BP Readings from Last 3 Encounters:  10/31/17 135/86  08/21/17 132/88  08/08/17 (!) 201/107    Visual Acuity Screening   Right eye Left eye Both eyes  Without correction:     With correction: 20/40 20/20 20/20     Physical Exam  Constitutional: She is oriented to person, place, and time. She appears well-developed and well-nourished.  HENT:  TM's intact bilaterally, no effusions or erythema. Nasal turbinates pink and moist, nasal passages patent. No sinus tenderness. Oropharynx clear, mucous membranes moist, dentition in good repair.  Eyes: Pupils are equal, round, and reactive to light. Conjunctivae and EOM are normal. Right eye exhibits no discharge. Left eye exhibits no discharge. No scleral icterus.  Neck: Normal range of motion. Neck supple. No thyromegaly present.  Cardiovascular: Normal rate, regular rhythm and intact distal pulses. Exam reveals no gallop and no friction rub.  No murmur heard. Pulmonary/Chest: No respiratory distress. She  has no wheezes. She has no rales.  Abdominal: Soft. Bowel sounds are normal. She exhibits no  distension and no mass. There is no tenderness. There is no rebound and no guarding.  Musculoskeletal: Normal range of motion. She exhibits no edema or tenderness.  Lymphadenopathy:    She has no cervical adenopathy.  Neurological: She is alert and oriented to person, place, and time. She has normal reflexes. She displays normal reflexes. Coordination normal.  Skin: Skin is warm and dry. No rash noted. No erythema. No pallor.  Psychiatric: She has a normal mood and affect.   Assessment and Plan :   Annual physical exam  Screening for endocrine, nutritional, metabolic and immunity disorder - Plan: Comprehensive metabolic panel, TSH, Lipid panel  Screening for deficiency anemia - Plan: CANCELED: CBC  Screening for HIV without presence of risk factors - Plan: CANCELED: HIV antibody  Encounter for hepatitis C screening test for low risk patient - Plan: CANCELED: Hepatitis C antibody  Essential hypertension - Plan: Microalbumin / creatinine urine ratio  Vitamin D deficiency - Plan: VITAMIN D 25 Hydroxy (Vit-D Deficiency, Fractures)  Bell's palsy  Facial paralysis/Bells palsy  Abnormal CBC - Plan: CBC with Differential/Platelet, Pathologist smear review  Family history of breast cancer  Family history of malignant neoplasm of breast  Screen for colon cancer - Plan: Cologuard  Patient is medically stable, very pleasant person.  Labs pending. Discussed healthy lifestyle, diet, exercise, preventative care, vaccinations, and addressed patient's concerns. I refilled her losartan hydrochlorothiazide for her hypertension.  Discussed the losartan recall.  Patient prefers to stay with the medication she is currently taking.  Follow-up in 6 months.  Patient states she will schedule her own mammogram.  Will use Cologuard for colon cancer screening.  Wallis BambergMario Moneka Mcquinn, PA-C [facility] at [facility] [phone] 10/31/2017  10:35 AM

## 2017-11-01 LAB — CBC WITH DIFFERENTIAL/PLATELET
BASOS ABS: 0.1 10*3/uL (ref 0.0–0.2)
Basos: 0 %
EOS (ABSOLUTE): 0.1 10*3/uL (ref 0.0–0.4)
Eos: 1 %
HEMOGLOBIN: 14.5 g/dL (ref 11.1–15.9)
Hematocrit: 44.2 % (ref 34.0–46.6)
IMMATURE GRANS (ABS): 0 10*3/uL (ref 0.0–0.1)
IMMATURE GRANULOCYTES: 0 %
LYMPHS: 21 %
Lymphocytes Absolute: 2.4 10*3/uL (ref 0.7–3.1)
MCH: 28.4 pg (ref 26.6–33.0)
MCHC: 32.8 g/dL (ref 31.5–35.7)
MCV: 87 fL (ref 79–97)
MONOCYTES: 9 %
Monocytes Absolute: 1 10*3/uL — ABNORMAL HIGH (ref 0.1–0.9)
NEUTROS PCT: 69 %
Neutrophils Absolute: 7.6 10*3/uL — ABNORMAL HIGH (ref 1.4–7.0)
Platelets: 301 10*3/uL (ref 150–450)
RBC: 5.1 x10E6/uL (ref 3.77–5.28)
RDW: 15.1 % (ref 12.3–15.4)
WBC: 11.2 10*3/uL — ABNORMAL HIGH (ref 3.4–10.8)

## 2017-11-01 LAB — MICROALBUMIN / CREATININE URINE RATIO
CREATININE, UR: 111.6 mg/dL
Microalb/Creat Ratio: 11.8 mg/g creat (ref 0.0–30.0)
Microalbumin, Urine: 13.2 ug/mL

## 2017-11-02 LAB — COMPREHENSIVE METABOLIC PANEL
A/G RATIO: 1.6 (ref 1.2–2.2)
ALK PHOS: 178 IU/L — AB (ref 39–117)
ALT: 20 IU/L (ref 0–32)
AST: 22 IU/L (ref 0–40)
Albumin: 4.5 g/dL (ref 3.5–5.5)
BUN/Creatinine Ratio: 23 (ref 9–23)
BUN: 16 mg/dL (ref 6–24)
Bilirubin Total: 0.5 mg/dL (ref 0.0–1.2)
CALCIUM: 9.4 mg/dL (ref 8.7–10.2)
CO2: 21 mmol/L (ref 20–29)
Chloride: 102 mmol/L (ref 96–106)
Creatinine, Ser: 0.69 mg/dL (ref 0.57–1.00)
GFR calc Af Amer: 110 mL/min/{1.73_m2} (ref >59)
GFR calc non Af Amer: 96 mL/min/{1.73_m2} (ref >59)
GLOBULIN, TOTAL: 2.9 g/dL (ref 1.5–4.5)
Glucose: 117 mg/dL — ABNORMAL HIGH (ref 65–99)
POTASSIUM: 4.4 mmol/L (ref 3.5–5.2)
SODIUM: 142 mmol/L (ref 134–144)
Total Protein: 7.4 g/dL (ref 6.0–8.5)

## 2017-11-02 LAB — VITAMIN D 25 HYDROXY (VIT D DEFICIENCY, FRACTURES): Vit D, 25-Hydroxy: 18.5 ng/mL — ABNORMAL LOW (ref 30.0–100.0)

## 2017-11-02 LAB — CBC
HEMOGLOBIN: 14.6 g/dL (ref 11.1–15.9)
Hematocrit: 44.6 % (ref 34.0–46.6)
MCH: 28.5 pg (ref 26.6–33.0)
MCHC: 32.7 g/dL (ref 31.5–35.7)
MCV: 87 fL (ref 79–97)
PLATELETS: 310 10*3/uL (ref 150–450)
RBC: 5.13 x10E6/uL (ref 3.77–5.28)
RDW: 15.2 % (ref 12.3–15.4)
WBC: 11.5 10*3/uL — ABNORMAL HIGH (ref 3.4–10.8)

## 2017-11-02 LAB — LIPID PANEL
CHOLESTEROL TOTAL: 154 mg/dL (ref 100–199)
Chol/HDL Ratio: 3.3 ratio (ref 0.0–4.4)
HDL: 46 mg/dL (ref >39)
LDL CALC: 91 mg/dL (ref 0–99)
TRIGLYCERIDES: 87 mg/dL (ref 0–149)
VLDL Cholesterol Cal: 17 mg/dL (ref 5–40)

## 2017-11-02 LAB — TSH: TSH: 2.1 u[IU]/mL (ref 0.450–4.500)

## 2017-11-02 MED FILL — LOSARTAN-HCTZ 50-12.5 MG TA: 50-12.5 | 90 days supply | Qty: 90 | Fill #0

## 2017-11-06 ENCOUNTER — Other Ambulatory Visit: Payer: Self-pay | Admitting: Urgent Care

## 2017-11-06 ENCOUNTER — Encounter: Payer: Self-pay | Admitting: Urgent Care

## 2017-11-06 DIAGNOSIS — E559 Vitamin D deficiency, unspecified: Secondary | ICD-10-CM

## 2017-11-06 MED ORDER — VITAMIN D 50 MCG (2000 UT) PO TABS: 2000.0000 [IU] | ORAL_TABLET | Freq: Every day | ORAL | 3 refills | Status: DC

## 2017-11-11 LAB — PATHOLOGIST SMEAR REVIEW
Basophils Absolute: 0 10*3/uL (ref 0.0–0.2)
Basos: 0 %
EOS (ABSOLUTE): 0.1 10*3/uL (ref 0.0–0.4)
Eos: 1 %
Hematocrit: 43.8 % (ref 34.0–46.6)
Hemoglobin: 14.5 g/dL (ref 11.1–15.9)
Immature Grans (Abs): 0 10*3/uL (ref 0.0–0.1)
Immature Granulocytes: 0 %
Lymphocytes Absolute: 2.5 10*3/uL (ref 0.7–3.1)
Lymphs: 21 %
MCH: 29.1 pg (ref 26.6–33.0)
MCHC: 33.1 g/dL (ref 31.5–35.7)
MCV: 88 fL (ref 79–97)
MONOCYTES: 8 %
Monocytes Absolute: 0.9 10*3/uL (ref 0.1–0.9)
NEUTROS ABS: 8 10*3/uL — AB (ref 1.4–7.0)
Neutrophils: 70 %
PATH REV PLTS: NORMAL
PATH REV RBC: NORMAL
Platelets: 311 10*3/uL (ref 150–450)
RBC: 4.98 x10E6/uL (ref 3.77–5.28)
RDW: 15.3 % (ref 12.3–15.4)
WBC: 11.6 10*3/uL — ABNORMAL HIGH (ref 3.4–10.8)

## 2017-12-03 DIAGNOSIS — Z1211 Encounter for screening for malignant neoplasm of colon: Secondary | ICD-10-CM | POA: Diagnosis not present

## 2017-12-18 LAB — COLOGUARD: Cologuard: NEGATIVE

## 2018-01-07 MED FILL — SODIUM CHLORIDE 0.9% INHAL: 0.9 | 30 days supply | Qty: 90 | Fill #0

## 2018-02-08 MED FILL — LOSARTAN-HCTZ 50-12.5 MG TA: 50-12.5 | 90 days supply | Qty: 90 | Fill #1

## 2018-02-09 MED FILL — SODIUM CHLORIDE 0.9% INHAL: 0.9 | 90 days supply | Qty: 270 | Fill #0

## 2018-03-17 ENCOUNTER — Other Ambulatory Visit: Payer: Self-pay | Admitting: Urgent Care

## 2018-03-17 DIAGNOSIS — Z1231 Encounter for screening mammogram for malignant neoplasm of breast: Secondary | ICD-10-CM

## 2018-03-22 ENCOUNTER — Ambulatory Visit
Admission: RE | Admit: 2018-03-22 | Discharge: 2018-03-22 | Disposition: A | Discharge status: Home / Self Care | Payer: 59 | Source: Ambulatory Visit

## 2018-03-22 DIAGNOSIS — Z1231 Encounter for screening mammogram for malignant neoplasm of breast: Secondary | ICD-10-CM | POA: Diagnosis not present

## 2018-03-24 ENCOUNTER — Other Ambulatory Visit: Payer: Self-pay | Admitting: Urgent Care

## 2018-03-24 DIAGNOSIS — R928 Other abnormal and inconclusive findings on diagnostic imaging of breast: Secondary | ICD-10-CM

## 2018-03-26 ENCOUNTER — Other Ambulatory Visit: Payer: Self-pay | Admitting: Urgent Care

## 2018-03-26 ENCOUNTER — Ambulatory Visit
Admission: RE | Admit: 2018-03-26 | Discharge: 2018-03-26 | Disposition: A | Discharge status: Home / Self Care | Payer: 59 | Source: Ambulatory Visit | Attending: Urgent Care | Admitting: Urgent Care

## 2018-03-26 DIAGNOSIS — N6001 Solitary cyst of right breast: Secondary | ICD-10-CM

## 2018-03-26 DIAGNOSIS — R928 Other abnormal and inconclusive findings on diagnostic imaging of breast: Secondary | ICD-10-CM

## 2018-04-08 ENCOUNTER — Other Ambulatory Visit: Payer: 59

## 2018-04-11 ENCOUNTER — Emergency Department (HOSPITAL_COMMUNITY): Payer: 59

## 2018-04-11 ENCOUNTER — Encounter (HOSPITAL_COMMUNITY): Payer: Self-pay | Admitting: Emergency Medicine

## 2018-04-11 ENCOUNTER — Emergency Department (HOSPITAL_COMMUNITY)
Admission: EM | Admit: 2018-04-11 | Discharge: 2018-04-11 | Disposition: A | Discharge status: Home / Self Care | Payer: 59 | Attending: Emergency Medicine | Admitting: Emergency Medicine

## 2018-04-11 DIAGNOSIS — R0789 Other chest pain: Secondary | ICD-10-CM | POA: Insufficient documentation

## 2018-04-11 DIAGNOSIS — Z79899 Other long term (current) drug therapy: Secondary | ICD-10-CM | POA: Insufficient documentation

## 2018-04-11 DIAGNOSIS — Z7982 Long term (current) use of aspirin: Secondary | ICD-10-CM | POA: Diagnosis not present

## 2018-04-11 DIAGNOSIS — R079 Chest pain, unspecified: Secondary | ICD-10-CM

## 2018-04-11 DIAGNOSIS — I10 Essential (primary) hypertension: Secondary | ICD-10-CM | POA: Insufficient documentation

## 2018-04-11 LAB — CBC WITH DIFFERENTIAL/PLATELET
Abs Immature Granulocytes: 0.1 10*3/uL — ABNORMAL HIGH (ref 0.00–0.07)
BASOS ABS: 0.1 10*3/uL (ref 0.0–0.1)
Basophils Relative: 1 %
EOS ABS: 0.3 10*3/uL (ref 0.0–0.5)
EOS PCT: 2 %
HCT: 46.1 % — ABNORMAL HIGH (ref 36.0–46.0)
Hemoglobin: 14.2 g/dL (ref 12.0–15.0)
Immature Granulocytes: 1 %
Lymphocytes Relative: 15 %
Lymphs Abs: 2.4 10*3/uL (ref 0.7–4.0)
MCH: 27.6 pg (ref 26.0–34.0)
MCHC: 30.8 g/dL (ref 30.0–36.0)
MCV: 89.5 fL (ref 80.0–100.0)
Monocytes Absolute: 1.2 10*3/uL — ABNORMAL HIGH (ref 0.1–1.0)
Monocytes Relative: 8 %
NRBC: 0 % (ref 0.0–0.2)
Neutro Abs: 12.2 10*3/uL — ABNORMAL HIGH (ref 1.7–7.7)
Neutrophils Relative %: 73 %
Platelets: 323 10*3/uL (ref 150–400)
RBC: 5.15 MIL/uL — AB (ref 3.87–5.11)
RDW: 13.3 % (ref 11.5–15.5)
WBC: 16.2 10*3/uL — ABNORMAL HIGH (ref 4.0–10.5)

## 2018-04-11 LAB — COMPREHENSIVE METABOLIC PANEL
ALBUMIN: 3.7 g/dL (ref 3.5–5.0)
ALT: 28 U/L (ref 0–44)
ANION GAP: 15 (ref 5–15)
AST: 28 U/L (ref 15–41)
Alkaline Phosphatase: 156 U/L — ABNORMAL HIGH (ref 38–126)
BUN: 10 mg/dL (ref 6–20)
CO2: 22 mmol/L (ref 22–32)
Calcium: 10 mg/dL (ref 8.9–10.3)
Chloride: 100 mmol/L (ref 98–111)
Creatinine, Ser: 0.64 mg/dL (ref 0.44–1.00)
GFR calc Af Amer: >60 mL/min (ref >60)
GFR calc non Af Amer: >60 mL/min (ref >60)
GLUCOSE: 114 mg/dL — AB (ref 70–99)
Potassium: 4.2 mmol/L (ref 3.5–5.1)
SODIUM: 137 mmol/L (ref 135–145)
TOTAL PROTEIN: 7.6 g/dL (ref 6.5–8.1)
Total Bilirubin: 0.7 mg/dL (ref 0.3–1.2)

## 2018-04-11 LAB — D-DIMER, QUANTITATIVE
D-Dimer, Quant: 1.09 ug/mL-FEU — ABNORMAL HIGH (ref 0.00–0.50)
D-Dimer, Quant: 1.11 ug/mL-FEU — ABNORMAL HIGH (ref 0.00–0.50)

## 2018-04-11 LAB — LIPASE, BLOOD: Lipase: 47 U/L (ref 11–51)

## 2018-04-11 LAB — TROPONIN I: Troponin I: 0.03 ng/mL (ref <0.03)

## 2018-04-11 MED ORDER — IOPAMIDOL (ISOVUE-370) INJECTION 76%
INTRAVENOUS | Status: AC
  Filled 2018-04-11: qty 100

## 2018-04-11 MED ORDER — IBUPROFEN 600 MG PO TABS: 600.0000 mg | ORAL_TABLET | Freq: Four times a day (QID) | ORAL | 0 refills | Status: DC | PRN

## 2018-04-11 MED ORDER — IOPAMIDOL (ISOVUE-370) INJECTION 76%
100.0000 mL | Freq: Once | INTRAVENOUS | Status: AC | PRN
  Administered 2018-04-11: 100 mL via INTRAVENOUS
  Administered 2018-04-11: 21:00:00 via INTRAVENOUS

## 2018-04-11 NOTE — ED Notes (Signed)
Patient verbalizes understanding of discharge instructions. Opportunity for questioning and answers were provided. Armband removed by staff, pt discharged from ED home with family via POV.  

## 2018-04-11 NOTE — Discharge Instructions (Signed)
Please take ibuprofen every 6 hours as needed per the prescription Your testing showed no signs of blood clot or heart attack If you should develop severe or worsening symptoms return to the emergency department See your doctor within 2 or 3 days for a recheck.

## 2018-04-11 NOTE — ED Notes (Signed)
Patient transported to X-ray 

## 2018-04-11 NOTE — ED Notes (Signed)
Nurse starting IV and drawing labs. 

## 2018-04-11 NOTE — ED Provider Notes (Signed)
[facility] EMERGENCY DEPARTMENT Provider Note   CSN: [id number] Arrival date & time: 04/11/18  1830     History   Chief Complaint Chief Complaint  Patient presents with  . Chest Pain    HPI Tiffany Reynolds is a 57 y.o. female.  HPI  The patient is a 57 year old female, history of Bell's palsy, history of hypertension, takes Hyzaar and does not smoke or drink.  She has no history of exertional symptoms, notes that this morning approximately 10 hours ago she developed some sharp and stabbing central chest pain, this is not made worse with deep breathing, position and is not associated with coughing or fevers.  She denies any risk factors for pulmonary embolism including travel, trauma, immobilization, recent surgery, cancer, estrogen use or tobacco use.  She denies any history of exertional symptoms and has not had any heart problems in the past.  She is not having palpitations.  Because the chest pain has been fluctuating throughout the day she came for further evaluation.  Of note there is no radiation of the pain and she is not short of breath diaphoretic or nauseated.  Past Medical History:  Diagnosis Date  . Allergy   . Anemia   . Bell's palsy   . Blood transfusion without reported diagnosis   . Depression   . Hypertension     Patient Active Problem List   Diagnosis Date Noted  . Facial paralysis/Bells palsy 08/21/2017  . Cough 08/21/2017  . Family history of malignant neoplasm of breast 10/21/2013    Past Surgical History:  Procedure Laterality Date  . CARPAL TUNNEL RELEASE    . CESAREAN SECTION    . TONSILLECTOMY       OB History   None      Home Medications    Prior to Admission medications   Medication Sig Start Date End Date Taking? Authorizing Provider  aspirin EC 325 MG tablet Take 650 mg by mouth once.   Yes [provider]  aspirin-acetaminophen-caffeine (EXCEDRIN MIGRAINE) [phone] MG tablet Take 2 tablets by mouth  2 (two) times daily as needed for headache or migraine.   Yes [provider]  Calcium Carbonate Antacid (TUMS ULTRA 1000 PO) Take 2,000 mg by mouth 2 (two) times daily as needed (heartburn).   Yes [provider]  cholecalciferol (VITAMIN D3) 25 MCG (1000 UT) tablet Take 1,000 Units by mouth daily.   Yes [provider]  esomeprazole (NEXIUM) 20 MG capsule Take 20 mg by mouth daily as needed (heartburn).   Yes [provider]  losartan-hydrochlorothiazide (HYZAAR) 50-12.5 MG tablet Take 1 tablet by mouth daily. 10/31/17  Yes Wallis Bamberg, PA-C  ranitidine (ZANTAC) 150 MG capsule Take 1 capsule (150 mg total) by mouth 2 (two) times daily. Patient taking differently: Take 150 mg by mouth daily as needed for heartburn.  08/21/17  Yes Porfirio Oar, PA  Cholecalciferol (VITAMIN D) 2000 units tablet Take 1 tablet (2,000 Units total) by mouth daily. Patient not taking: Reported on 04/11/2018 11/06/17   Wallis Bamberg, PA-C  ibuprofen (ADVIL,MOTRIN) 600 MG tablet Take 1 tablet (600 mg total) by mouth every 6 (six) hours as needed. 04/11/18   Eber Hong, MD    Family History Family History  Problem Relation Age of Onset  . Cancer Mother 73       breast  . Hyperlipidemia Father   . Cancer Maternal Grandmother 55       breast  . Cancer Paternal Aunt  75       breast    Social History Social History   Tobacco Use  . Smoking status: Never Smoker  . Smokeless tobacco: Never Used  Substance Use Topics  . Alcohol use: No  . Drug use: No     Allergies   Tetracyclines & related; Albuterol; Amlodipine; Naproxen sodium; Other; and Vicodin [hydrocodone-acetaminophen]   Review of Systems Review of Systems  All other systems reviewed and are negative.    Physical Exam Updated Vital Signs BP (!) 179/93   Pulse (!) 105   Temp 97.7 F (36.5 C) (Oral)   Resp 19   LMP 04/11/2018 (Exact Date)   SpO2 98%   Physical Exam  Constitutional: She appears  well-developed and well-nourished. No distress.  HENT:  Head: Normocephalic and atraumatic.  Mouth/Throat: Oropharynx is clear and moist. No oropharyngeal exudate.  Eyes: Pupils are equal, round, and reactive to light. Conjunctivae and EOM are normal. Right eye exhibits no discharge. Left eye exhibits no discharge. No scleral icterus.  Neck: Normal range of motion. Neck supple. No JVD present. No thyromegaly present.  Cardiovascular: Regular rhythm, normal heart sounds and intact distal pulses. Exam reveals no gallop and no friction rub.  No murmur heard. Tachycardia to 110  Pulmonary/Chest: Effort normal and breath sounds normal. No respiratory distress. She has no wheezes. She has no rales.  Abdominal: Soft. Bowel sounds are normal. She exhibits no distension and no mass. There is no tenderness.  Musculoskeletal: Normal range of motion. She exhibits no edema or tenderness.  Lymphadenopathy:    She has no cervical adenopathy.  Neurological: She is alert. Coordination normal.  Skin: Skin is warm and dry. No rash noted. No erythema.  Psychiatric: She has a normal mood and affect. Her behavior is normal.  Nursing note and vitals reviewed.    ED Treatments / Results  Labs (all labs ordered are listed, but only abnormal results are displayed) Labs Reviewed  CBC WITH DIFFERENTIAL/PLATELET - Abnormal; Notable for the following components:      Result Value   WBC 16.2 (*)    RBC 5.15 (*)    HCT 46.1 (*)    Neutro Abs 12.2 (*)    Monocytes Absolute 1.2 (*)    Abs Immature Granulocytes 0.10 (*)    All other components within normal limits  COMPREHENSIVE METABOLIC PANEL - Abnormal; Notable for the following components:   Glucose, Bld 114 (*)    Alkaline Phosphatase 156 (*)    All other components within normal limits  TROPONIN I - Abnormal; Notable for the following components:   Troponin I 0.03 (*)    All other components within normal limits  D-DIMER, QUANTITATIVE (NOT AT [facility]) -  Abnormal; Notable for the following components:   D-Dimer, Quant 1.09 (*)    All other components within normal limits  D-DIMER, QUANTITATIVE (NOT AT [facility]) - Abnormal; Notable for the following components:   D-Dimer, Quant 1.11 (*)    All other components within normal limits  LIPASE, BLOOD  TROPONIN I    EKG EKG Interpretation  Date/Time:  Sunday April 11 2018 18:41:33 EST Ventricular Rate:  114 PR Interval:    QRS Duration: 94 QT Interval:  331 QTC Calculation: 456 R Axis:   -66 Text Interpretation:  Sinus tachycardia Inferior infarct, old Consider anterior infarct Poor R wave progression No old tracing to compare Confirmed by Eber HongMiller, Ashli Selders ([id number]) on 04/11/2018 6:56:06 PM   Radiology Dg Chest 2 View  Result Date:  04/11/2018 CLINICAL DATA:  Chest pain EXAM: CHEST - 2 VIEW COMPARISON:  08/21/2017 FINDINGS: Lungs are clear.  No pleural effusion or pneumothorax. The heart is normal in size. Mild degenerative changes of the visualized thoracolumbar spine. IMPRESSION: Normal chest radiographs. Electronically Signed   By: Charline Bills M.D.   On: 04/11/2018 20:16   Ct Angio Chest Pe W And/or Wo Contrast  Result Date: 04/11/2018 CLINICAL DATA:  57 year old female with chest pain radiating to the back. EXAM: CT ANGIOGRAPHY CHEST WITH CONTRAST TECHNIQUE: Multidetector CT imaging of the chest was performed using the standard protocol during bolus administration of intravenous contrast. Multiplanar CT image reconstructions and MIPs were obtained to evaluate the vascular anatomy. CONTRAST:  ISOVUE-370 IOPAMIDOL (ISOVUE-370) INJECTION 76% COMPARISON:  Chest radiograph dated 04/11/2018 FINDINGS: Cardiovascular: There is mild cardiomegaly. No pericardial effusion. Coronary vascular calcification of the LAD. The thoracic aorta is unremarkable. Evaluation of the pulmonary arteries is limited due to suboptimal opacification of the peripheral branches. No large or central pulmonary artery  embolus identified. Mediastinum/Nodes: There is no hilar or mediastinal adenopathy. Esophagus is grossly unremarkable. Slight heterogeneity of the thyroid gland with apparent small hypodense nodules. Further evaluation with ultrasound on a nonemergent basis recommended. No mediastinal fluid collection. Lungs/Pleura: Right lung base subpleural linear atelectasis/scarring. There is no focal consolidation, pleural effusion, or pneumothorax. The central airways are patent. Upper Abdomen: Probable fatty infiltration of the liver. The visualized upper abdomen is otherwise unremarkable. Musculoskeletal: Degenerative changes of the spine. No acute osseous pathology. Review of the MIP images confirms the above findings. IMPRESSION: No acute intrathoracic pathology. No CT evidence of central pulmonary artery embolus. Electronically Signed   By: Elgie Collard M.D.   On: 04/11/2018 21:46    Procedures Procedures (including critical care time)  Medications Ordered in ED Medications  iopamidol (ISOVUE-370) 76 % injection 100 mL (100 mLs Intravenous Contrast Given 04/11/18 2116)     Initial Impression / Assessment and Plan / ED Course  I have reviewed the triage vital signs and the nursing notes.  Pertinent labs & imaging results that were available during my care of the patient were reviewed by me and considered in my medical decision making (see chart for details).  Clinical Course as of Apr 12 2331  Wynelle Link Apr 11, 2018  2042 Chest x-ray negative, labs show a leukocytosis with an elevated d-dimer, no signs of pneumonia, rule out pulmonary embolism   [BM]    Clinical Course User Index [BM] Eber Hong, MD   The patient does have a resting tachycardia however she states that she has severe whitecoat syndrome.  At this time with her tachycardia, her obesity, her sharp and stabbing chest pain she will need evaluation for a pulmonary embolism.  EKG shows no signs of ischemia, the patient is agreeable to the  plan.  Troponin pending, one troponin should be sufficient given 10 hours of symptoms  Second troponin is undetectable, patient was informed of all of her results including the CT scan, she is agreeable to return should her symptoms worsen, no signs of pulmonary embolism, dissection or acute coronary syndrome, no murmurs or signs of pericarditis on the EKG, stable for discharge  Final Clinical Impressions(s) / ED Diagnoses   Final diagnoses:  Chest pain, unspecified type    ED Discharge Orders         Ordered    ibuprofen (ADVIL,MOTRIN) 600 MG tablet  Every 6 hours PRN     04/11/18 2331  Eber Hong, MD 04/11/18 [phone]

## 2018-04-11 NOTE — ED Notes (Signed)
Nurse drawing labs. 

## 2018-04-11 NOTE — ED Triage Notes (Signed)
Pt here from home with c/o chest pain since breakfast pain radiated to her back , pt has gerd and took all of her gerd meds with out relief

## 2018-04-12 MED FILL — IBUPROFEN 600 MG TABLET: 600 | 7 days supply | Qty: 30 | Fill #0

## 2018-04-21 ENCOUNTER — Telehealth: Payer: Self-pay | Admitting: Physician Assistant

## 2018-04-21 DIAGNOSIS — R059 Cough, unspecified: Secondary | ICD-10-CM

## 2018-04-21 DIAGNOSIS — R05 Cough: Secondary | ICD-10-CM

## 2018-04-21 NOTE — Telephone Encounter (Signed)
Copied from CRM [id number] [id number]. Topic: Quick Communication - Rx Refill/Question >> Apr 21, 2018  1:12 PM Elliot GaultBell, Tiffany M wrote: Medication: ranitidine (ZANTAC) 150 MG capsule currently on back order requesting pepcid (brand name)  Has the patient contacted their pharmacy? Pharmacy is the one calling stating 2 faxes were sent over checking on the status    Preferred Pharmacy (with phone number or street name): [facility] [place] - [address] [phone] (Phone) [phone] (Fax)  Agent: Please be advised that RX refills may take up to 3 business days. We ask that you follow-up with your pharmacy.

## 2018-04-22 NOTE — Telephone Encounter (Signed)
Please advise 

## 2018-04-25 MED ORDER — RANITIDINE HCL 150 MG PO CAPS: 150.0000 mg | ORAL_CAPSULE | Freq: Every day | ORAL | 1 refills | Status: DC | PRN

## 2018-04-25 NOTE — Addendum Note (Signed)
Addended by: Collie SiadSTALLINGS, Calen Posch A on: 04/25/2018 03:32 PM   Modules accepted: Orders

## 2018-05-24 ENCOUNTER — Telehealth: Payer: Self-pay | Admitting: Urgent Care

## 2018-05-24 MED FILL — LOSARTAN POTASSIUM 50 MG TA: 50 | 90 days supply | Qty: 90 | Fill #0

## 2018-05-24 MED FILL — HYDROCHLOROTHIAZIDE 12.5 MG: 12.5 | 90 days supply | Qty: 90 | Fill #0

## 2018-05-24 NOTE — Telephone Encounter (Signed)
Verbal over phone to split medication hyzaar on back order .

## 2018-05-24 NOTE — Telephone Encounter (Signed)
Copied from CRM [id number]. Topic: General - Other >> May 24, 2018 11:53 AM Marylen Ponto wrote: Reason for CRM: Pt stated that the losartan-hydrochlorothiazide (HYZAAR) 50-12.5 MG tablet is on back order and the pharmacy asked her to contact the office and have the prescriptions called in separately. Pt requests that the prescriptions go to [facility] - [place], [place] - [address] [place]. [phone] (Phone)  [phone] (Fax)

## 2018-05-24 NOTE — Telephone Encounter (Signed)
Will route to office for final disposition.; pt last seen by Wallis Bamberg 10/31/2017; no upcoming visits noted.

## 2018-09-22 ENCOUNTER — Other Ambulatory Visit: Payer: Self-pay | Admitting: Internal Medicine

## 2018-09-22 ENCOUNTER — Other Ambulatory Visit: Payer: Self-pay | Admitting: Physician Assistant

## 2018-09-23 ENCOUNTER — Other Ambulatory Visit: Payer: Self-pay | Admitting: Physician Assistant

## 2018-09-23 ENCOUNTER — Other Ambulatory Visit: Payer: Self-pay | Admitting: Internal Medicine

## 2018-09-23 ENCOUNTER — Other Ambulatory Visit: Payer: Self-pay | Admitting: Family Medicine

## 2018-09-23 ENCOUNTER — Other Ambulatory Visit: Payer: Self-pay | Admitting: Urgent Care

## 2018-09-23 DIAGNOSIS — N6001 Solitary cyst of right breast: Secondary | ICD-10-CM

## 2018-09-24 ENCOUNTER — Other Ambulatory Visit: Payer: 59

## 2019-01-14 IMAGING — DX DG CHEST 2V
2 series · 2 of 2 positions shown · non-contrast
Comparison: None.

CLINICAL DATA: Persistent cough

EXAM:
CHEST - 2 VIEW

[chest pa]
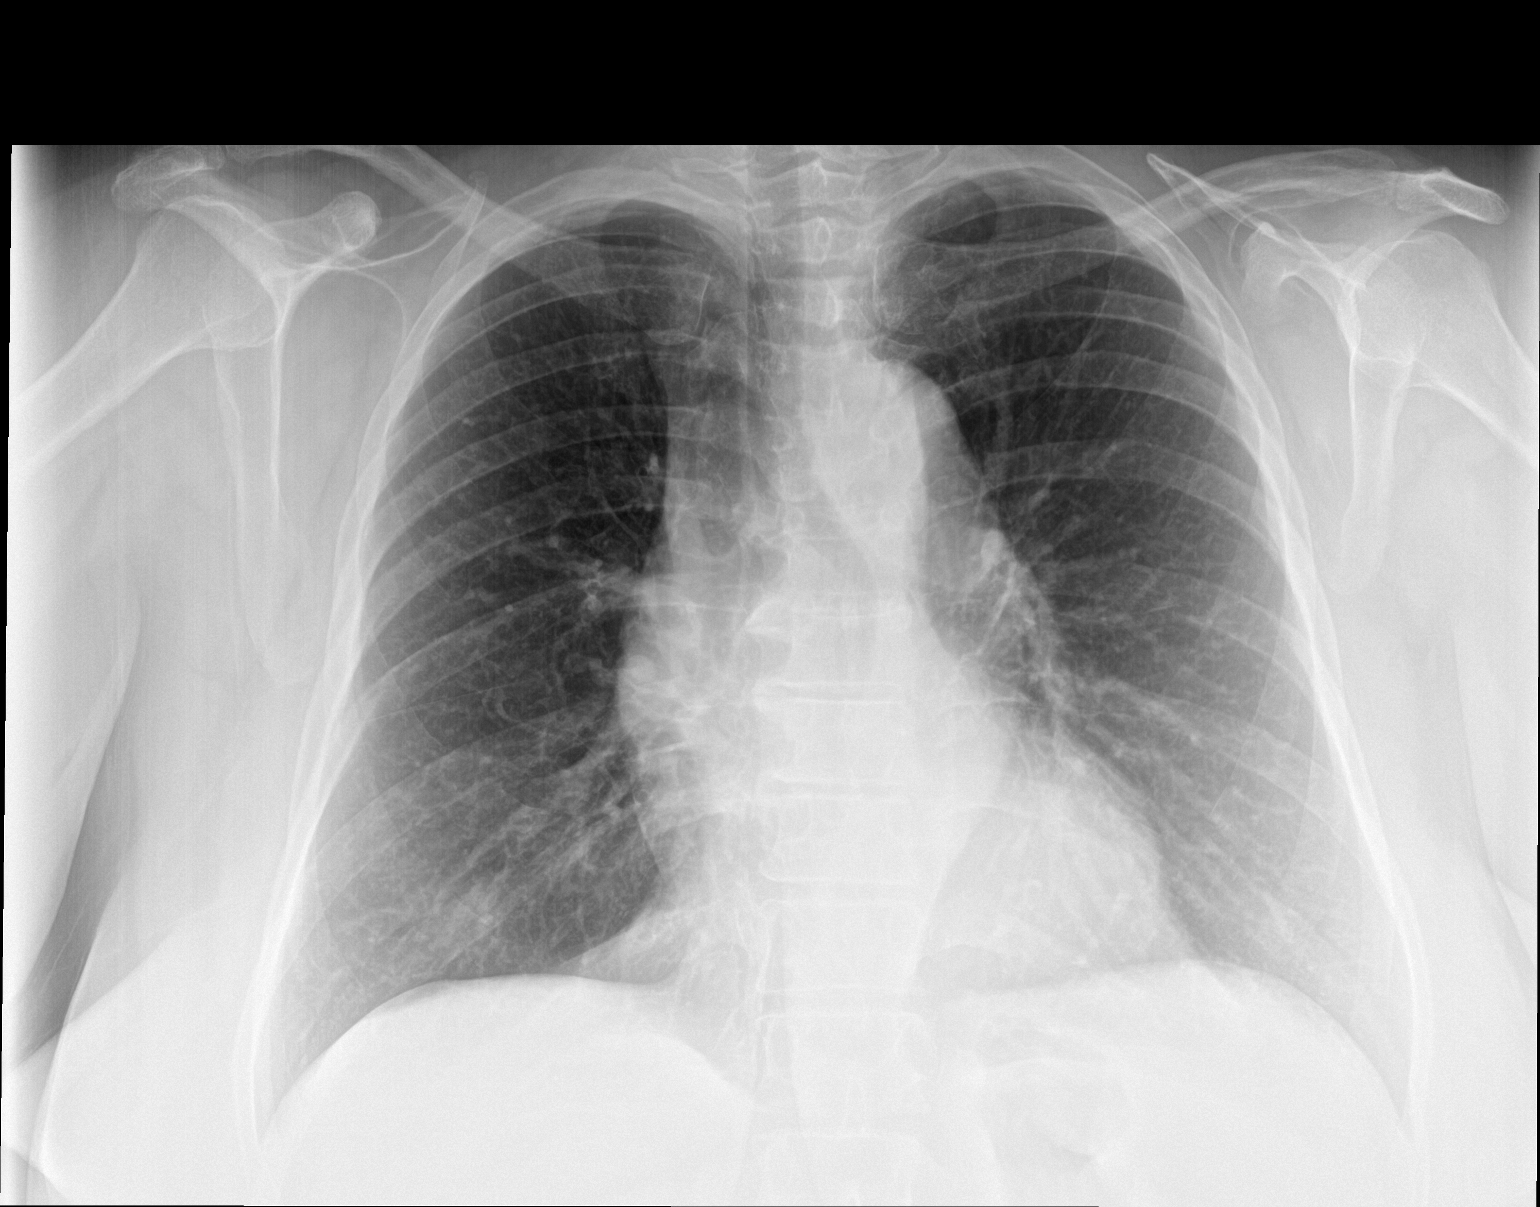

[chest lat]
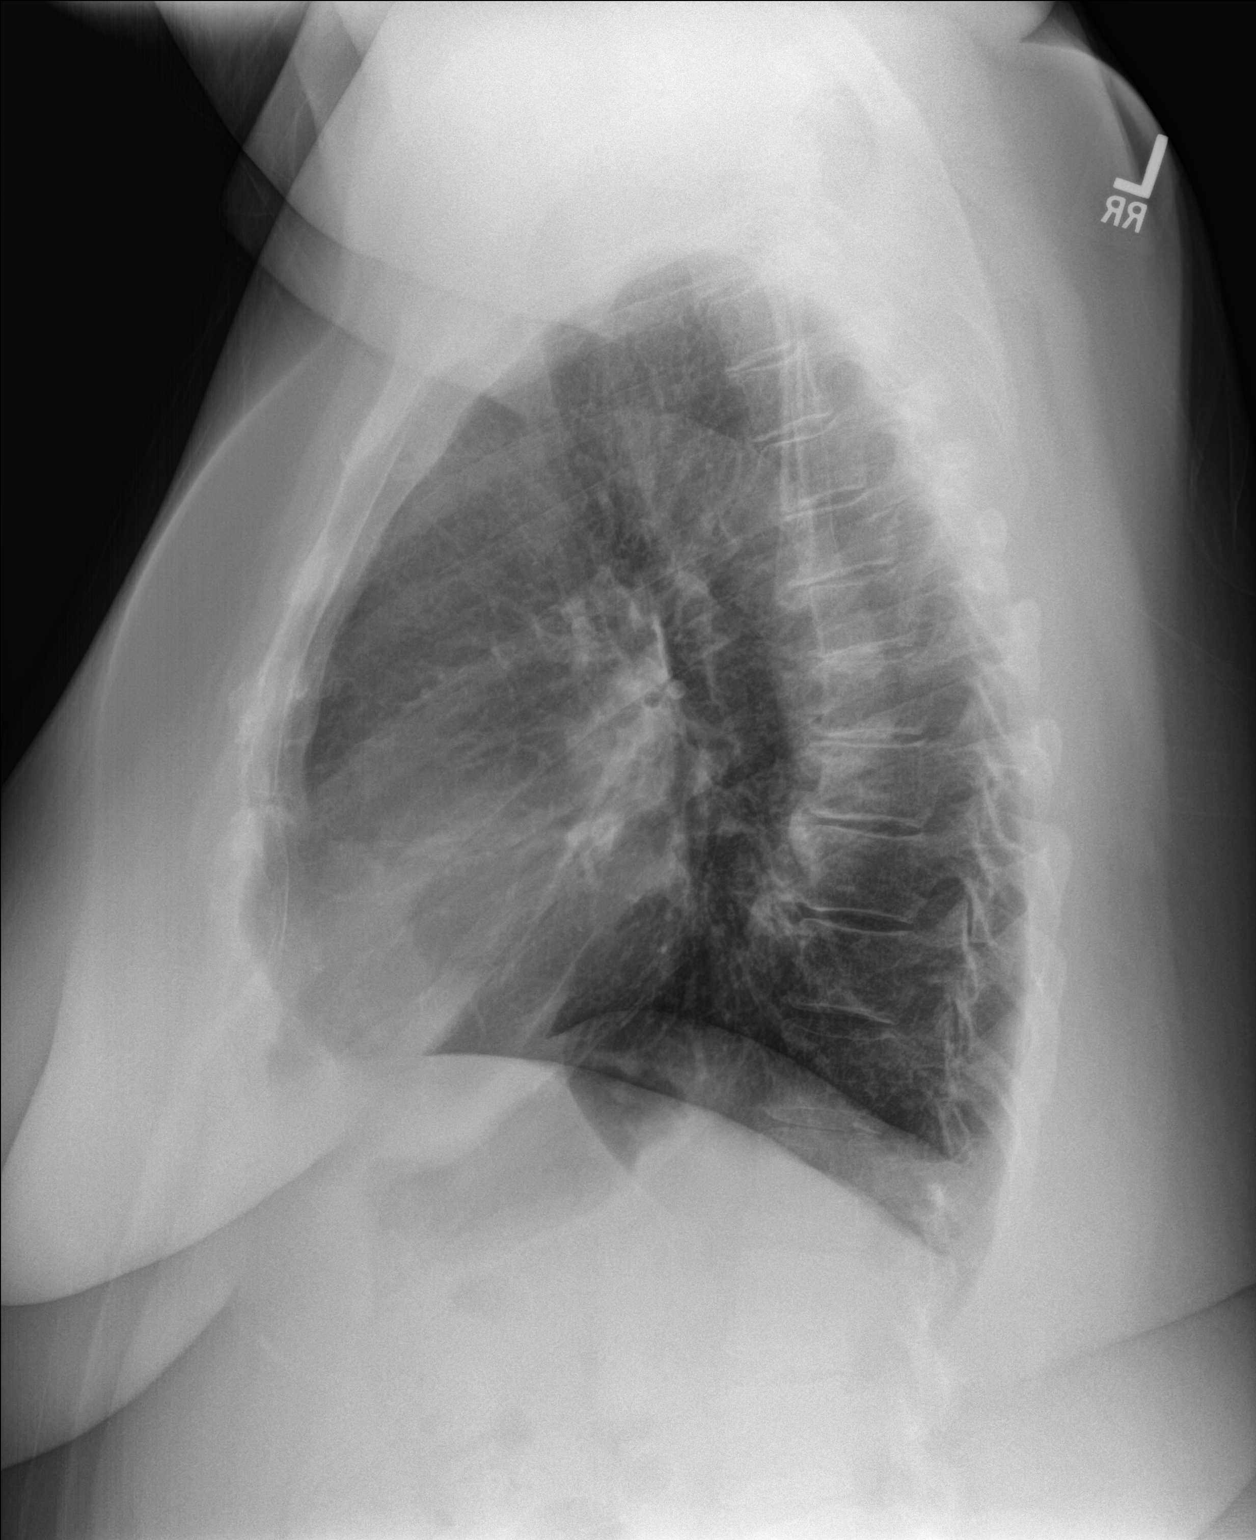

[2 of 2 positions shown; findings below may reference images not displayed]

FINDINGS: There is no edema or consolidation. The heart size and pulmonary
vascularity are normal. No adenopathy. Aorta is mildly tortuous.
There is degenerative change in the thoracic spine.
IMPRESSION: Mildly tortuous aorta.  No edema or consolidation.

## 2019-09-04 IMAGING — CR DG CHEST 2V
2 series · 2 of 2 positions shown · non-contrast
Comparison: 08/21/2017

CLINICAL DATA: Chest pain

EXAM:
CHEST - 2 VIEW

[chest pa]
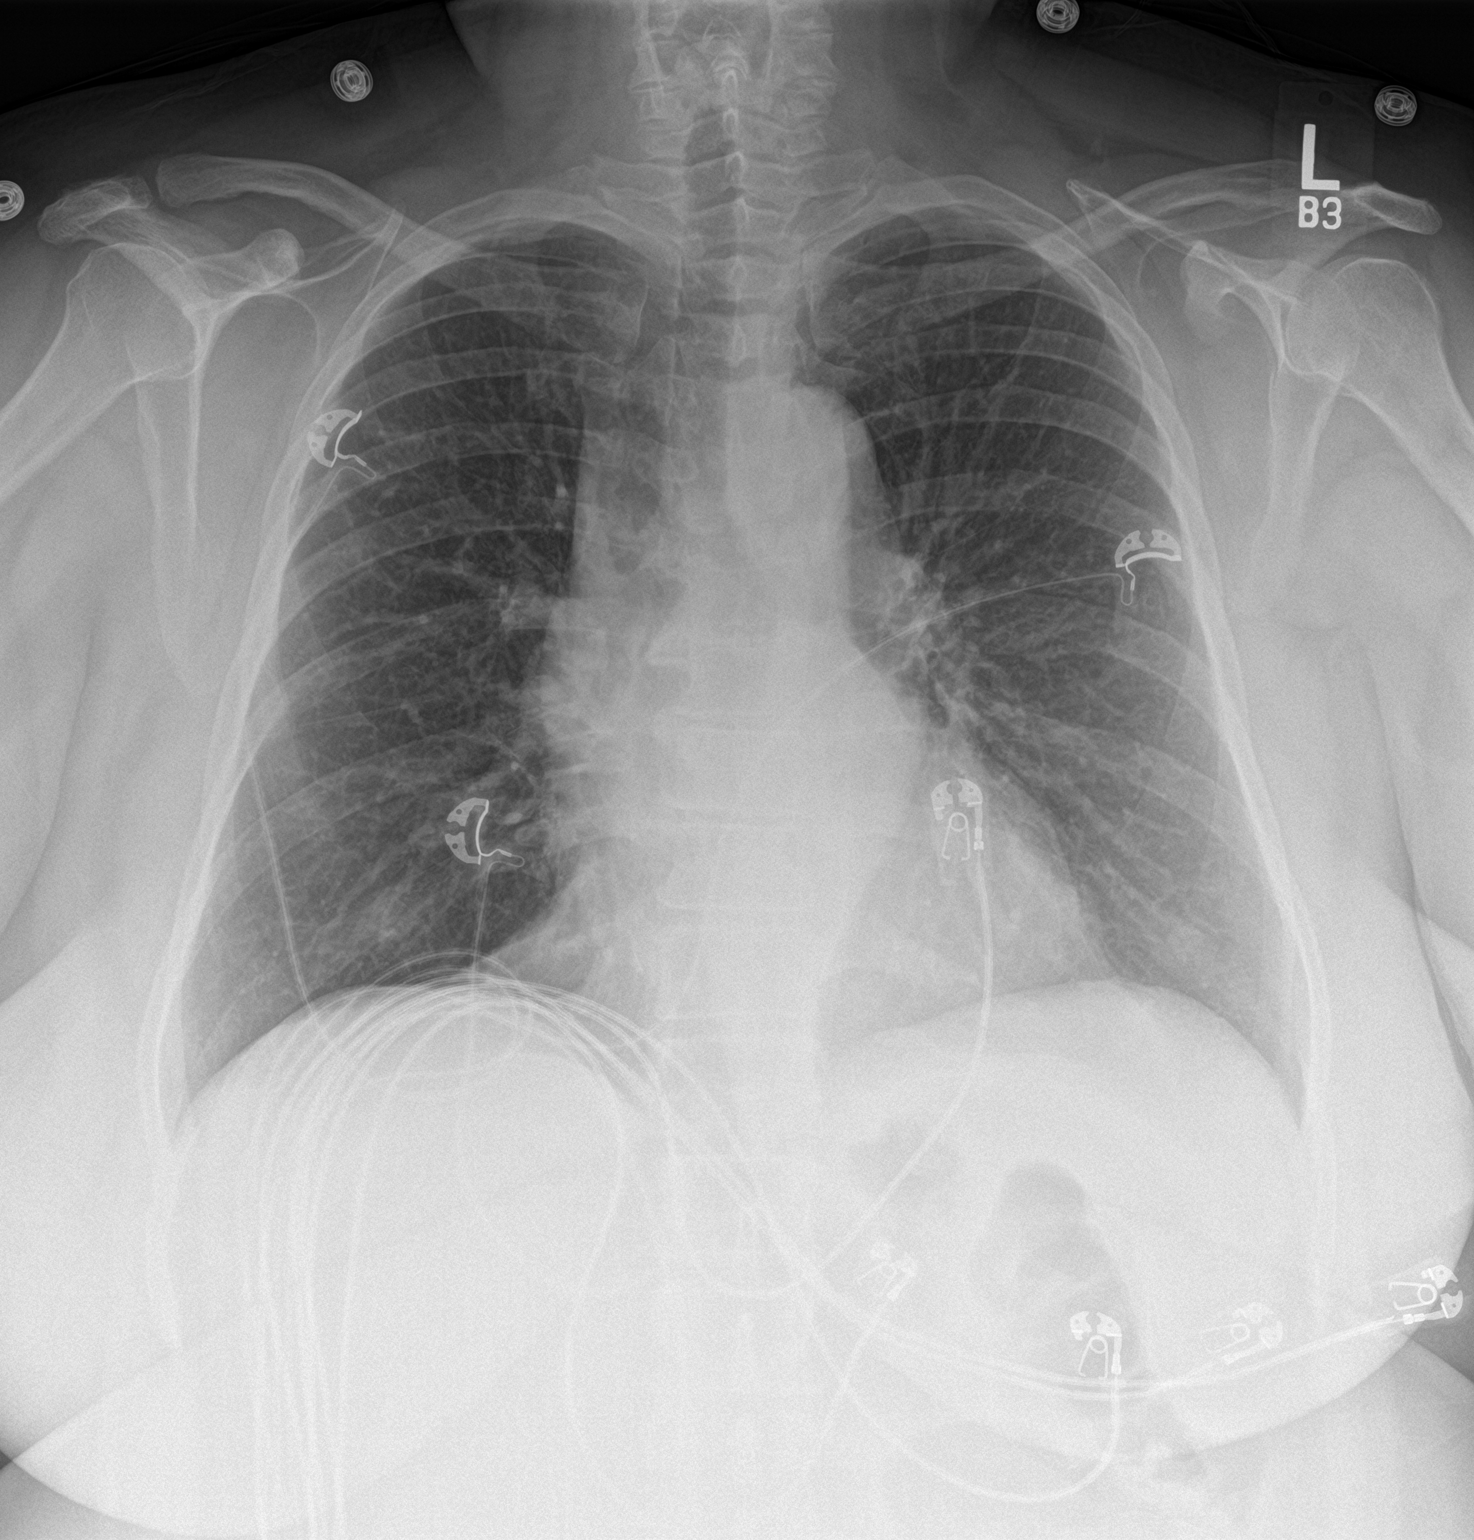

[chest lat]
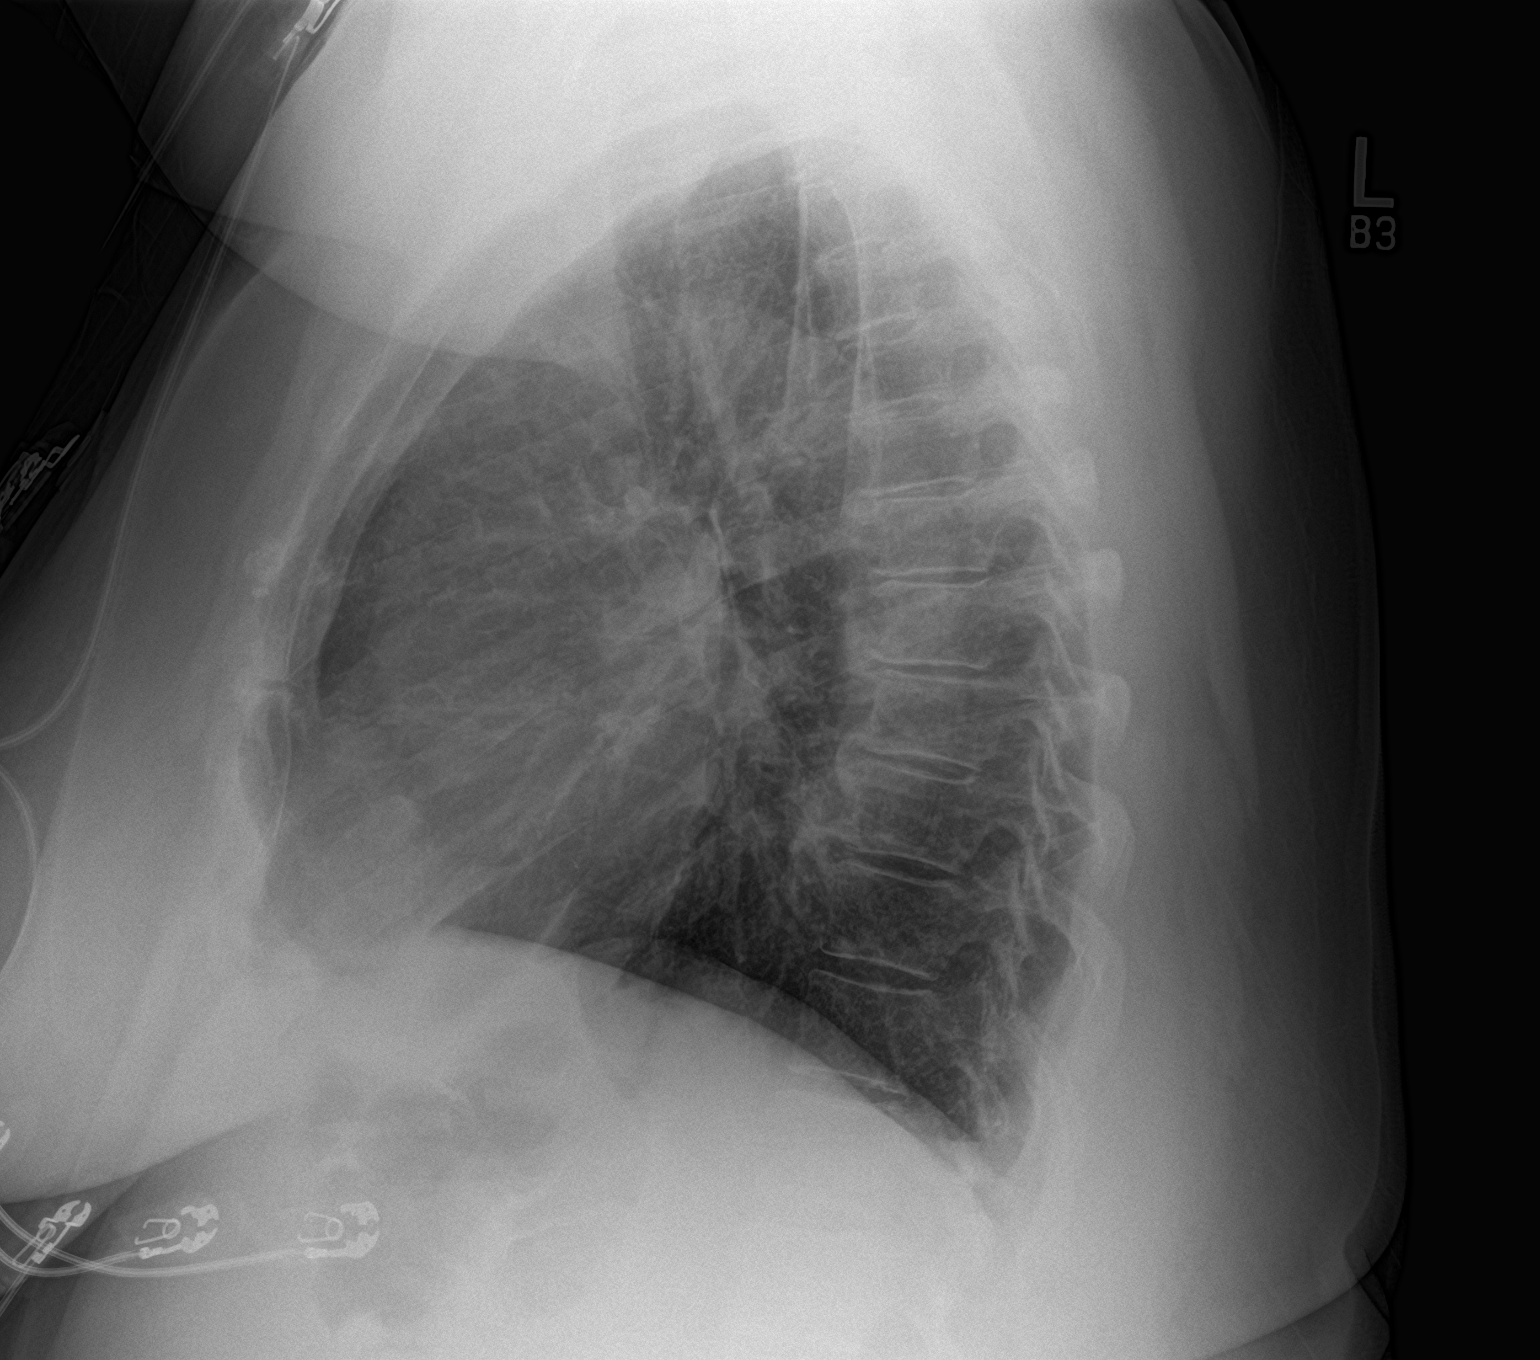

[2 of 2 positions shown; findings below may reference images not displayed]

FINDINGS: Lungs are clear.  No pleural effusion or pneumothorax.

The heart is normal in size.

Mild degenerative changes of the visualized thoracolumbar spine.
IMPRESSION: Normal chest radiographs.

## 2020-02-18 ENCOUNTER — Ambulatory Visit: Payer: 59 | Attending: Internal Medicine

## 2020-02-18 DIAGNOSIS — Z23 Encounter for immunization: Secondary | ICD-10-CM

## 2020-02-18 NOTE — Progress Notes (Signed)
   Covid-19 Vaccination Clinic  Name:  Tiffany Reynolds    MRN: [id number] DOB: June 30, 1960  02/18/2020  [name] was observed post Covid-19 immunization for 15 minutes without incident. She was provided with Vaccine Information Sheet and instruction to access the V-Safe system.   [name] was instructed to call [phone] with any severe reactions post vaccine: Marland Kitchen Difficulty breathing  . Swelling of face and throat  . A fast heartbeat  . A bad rash all over body  . Dizziness and weakness

## 2020-03-01 ENCOUNTER — Encounter (INDEPENDENT_AMBULATORY_CARE_PROVIDER_SITE_OTHER): Payer: Self-pay

## 2021-12-19 ENCOUNTER — Ambulatory Visit: Payer: 59 | Admitting: Physician Assistant

## 2021-12-26 ENCOUNTER — Ambulatory Visit (INDEPENDENT_AMBULATORY_CARE_PROVIDER_SITE_OTHER): Payer: 59 | Admitting: Physician Assistant

## 2021-12-26 ENCOUNTER — Encounter: Payer: Self-pay | Admitting: Physician Assistant

## 2021-12-26 VITALS — BP 138/72 | HR 102 | Temp 97.7°F | Ht 60.0 in | Wt 265.0 lb

## 2021-12-26 DIAGNOSIS — Z7689 Persons encountering health services in other specified circumstances: Secondary | ICD-10-CM | POA: Diagnosis not present

## 2021-12-26 DIAGNOSIS — Z0000 Encounter for general adult medical examination without abnormal findings: Secondary | ICD-10-CM

## 2021-12-26 DIAGNOSIS — I10 Essential (primary) hypertension: Secondary | ICD-10-CM | POA: Diagnosis not present

## 2021-12-26 DIAGNOSIS — E669 Obesity, unspecified: Secondary | ICD-10-CM | POA: Insufficient documentation

## 2021-12-26 DIAGNOSIS — N393 Stress incontinence (female) (male): Secondary | ICD-10-CM | POA: Insufficient documentation

## 2021-12-26 MED ORDER — LOSARTAN POTASSIUM-HCTZ 50-12.5 MG PO TABS: 1.0000 | ORAL_TABLET | Freq: Every day | ORAL | 1 refills | Status: DC

## 2021-12-26 NOTE — Assessment & Plan Note (Signed)
-  BP elevated on intake which improved after repeated. Will resume antihypertensive medication with Losartan-HCTZ 50-12.5 mg daily. Patient is fasting so will collect routine fasting labs including CMP and recommend to schedule CPE next week.

## 2021-12-26 NOTE — Patient Instructions (Signed)

## 2021-12-26 NOTE — Progress Notes (Signed)
New Patient Office Visit  Subjective    Patient ID: Tiffany Reynolds, female    DOB: 05-Jan-1961  Age: 61 y.o. MRN: [id number]  CC:  Chief Complaint  Patient presents with   New Patient (Initial Visit)    HPI Tiffany Reynolds presents to establish care. Patient has a past medical history of hypertension. Patient reports has been out of blood pressure medication (Losartan-HCTZ 50-12.5 mg) for 3 months. States also has hx of white coat syndrome. States has tolerated medication without issues. Does not check blood pressure at home routinely. Does check her pulse which is usually 79s. Patient denies chest pain, palpitations, shortness of breath or dizziness. Patient reports hx of iron deficiency anemia in the past. Patient denies new medical diagnosis or new surgeries.     Outpatient Encounter Medications as of 12/26/2021  Medication Sig   losartan-hydrochlorothiazide (HYZAAR) 50-12.5 MG tablet Take 1 tablet by mouth daily.   [DISCONTINUED] losartan-hydrochlorothiazide (HYZAAR) 50-12.5 MG tablet Take 1 tablet by mouth daily.   [DISCONTINUED] aspirin EC 325 MG tablet Take 650 mg by mouth once.   [DISCONTINUED] aspirin-acetaminophen-caffeine (EXCEDRIN MIGRAINE) 250-250-65 MG tablet Take 2 tablets by mouth 2 (two) times daily as needed for headache or migraine.   [DISCONTINUED] Calcium Carbonate Antacid (TUMS ULTRA 1000 PO) Take 2,000 mg by mouth 2 (two) times daily as needed (heartburn).   [DISCONTINUED] Cholecalciferol (VITAMIN D) 2000 units tablet Take 1 tablet (2,000 Units total) by mouth daily. (Patient not taking: Reported on 04/11/2018)   [DISCONTINUED] cholecalciferol (VITAMIN D3) 25 MCG (1000 UT) tablet Take 1,000 Units by mouth daily.   [DISCONTINUED] esomeprazole (NEXIUM) 20 MG capsule Take 20 mg by mouth daily as needed (heartburn).   [DISCONTINUED] ibuprofen (ADVIL,MOTRIN) 600 MG tablet Take 1 tablet (600 mg total) by mouth every 6 (six) hours as needed.   [DISCONTINUED] ranitidine  (ZANTAC) 150 MG capsule Take 1 capsule (150 mg total) by mouth daily as needed for heartburn.   No facility-administered encounter medications on file as of 12/26/2021.    Past Medical History:  Diagnosis Date   Allergy    Anemia    Bell's palsy    Blood transfusion without reported diagnosis    Depression    Hypertension     Past Surgical History:  Procedure Laterality Date   CARPAL TUNNEL RELEASE     CESAREAN SECTION     TONSILLECTOMY      Family History  Problem Relation Age of Onset   Cancer Mother 68       breast   Hyperlipidemia Father    Cancer Maternal Grandmother 60       breast   Cancer Paternal Aunt 34       breast    Social History   Socioeconomic History   Marital status: Married    Spouse name: Not on file   Number of children: Not on file   Years of education: Not on file   Highest education level: Not on file  Occupational History   Occupation: CNA/CMA student    Employer: [employer]  Tobacco Use   Smoking status: Never   Smokeless tobacco: Never  Substance and Sexual Activity   Alcohol use: No   Drug use: No   Sexual activity: Not on file  Other Topics Concern   Not on file  Social History Narrative   Married. Education: [employer].    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     Objective    BP 138/72   Pulse (!) 102   Temp 97.7 F (36.5 C)   Ht 5' (1.524 m)   Wt 265 lb (120.2 kg)   SpO2 98%   BMI 51.75 kg/m   Physical  Exam General: Cooperative, in no acute distress, appropriate for stated age.  Neuro:  Alert and oriented,  extra-ocular muscles intact  HEENT:  Normocephalic, atraumatic, neck supple  Skin:  no gross rash, warm, pink. Cardiac:  Reg rhythm, tachycardia, S1 S2 Respiratory: CTA B/L  Vascular:  Ext warm, no cyanosis apprec.; cap RF less 2 sec. Psych:  No HI/SI, judgement and insight good, Euthymic mood. Full Affect.      Assessment & Plan:   Problem List Items Addressed This Visit       Cardiovascular and Mediastinum   Primary hypertension - Primary    -BP elevated on intake which improved after repeated. Will resume antihypertensive medication with Losartan-HCTZ 50-12.5 mg daily. Patient is fasting so will collect routine fasting labs including CMP and recommend to schedule CPE next week.      Relevant Medications   losartan-hydrochlorothiazide (HYZAAR) 50-12.5 MG tablet   Other Relevant Orders   CBC w/Diff   Comp Met (CMET)   Other Visit Diagnoses     Encounter to establish care       Healthcare maintenance       Relevant Orders   CBC w/Diff   Comp Met (CMET)   Lipid Profile   HgB A1c   TSH       Return for CPE early next week.   Lorrene Reid, PA-C

## 2021-12-27 LAB — COMPREHENSIVE METABOLIC PANEL
ALT: 15 IU/L (ref 0–32)
AST: 19 IU/L (ref 0–40)
Albumin/Globulin Ratio: 1.5 (ref 1.2–2.2)
Albumin: 4.6 g/dL (ref 3.9–4.9)
Alkaline Phosphatase: 196 IU/L — ABNORMAL HIGH (ref 44–121)
BUN/Creatinine Ratio: 25 (ref 12–28)
BUN: 15 mg/dL (ref 8–27)
Bilirubin Total: 0.4 mg/dL (ref 0.0–1.2)
CO2: 19 mmol/L — ABNORMAL LOW (ref 20–29)
Calcium: 9.5 mg/dL (ref 8.7–10.3)
Chloride: 101 mmol/L (ref 96–106)
Creatinine, Ser: 0.59 mg/dL (ref 0.57–1.00)
Globulin, Total: 3 g/dL (ref 1.5–4.5)
Glucose: 84 mg/dL (ref 70–99)
Potassium: 4.3 mmol/L (ref 3.5–5.2)
Sodium: 142 mmol/L (ref 134–144)
Total Protein: 7.6 g/dL (ref 6.0–8.5)
eGFR: 101 mL/min/{1.73_m2} (ref >59)

## 2021-12-27 LAB — CBC WITH DIFFERENTIAL/PLATELET
Basophils Absolute: 0.1 10*3/uL (ref 0.0–0.2)
Basos: 1 %
EOS (ABSOLUTE): 0.3 10*3/uL (ref 0.0–0.4)
Eos: 2 %
Hematocrit: 46.1 % (ref 34.0–46.6)
Hemoglobin: 15.3 g/dL (ref 11.1–15.9)
Immature Grans (Abs): 0.1 10*3/uL (ref 0.0–0.1)
Immature Granulocytes: 1 %
Lymphocytes Absolute: 3 10*3/uL (ref 0.7–3.1)
Lymphs: 23 %
MCH: 28.5 pg (ref 26.6–33.0)
MCHC: 33.2 g/dL (ref 31.5–35.7)
MCV: 86 fL (ref 79–97)
Monocytes Absolute: 1.1 10*3/uL — ABNORMAL HIGH (ref 0.1–0.9)
Monocytes: 8 %
Neutrophils Absolute: 8.5 10*3/uL — ABNORMAL HIGH (ref 1.4–7.0)
Neutrophils: 65 %
Platelets: 354 10*3/uL (ref 150–450)
RBC: 5.37 x10E6/uL — ABNORMAL HIGH (ref 3.77–5.28)
RDW: 14.1 % (ref 11.7–15.4)
WBC: 13 10*3/uL — ABNORMAL HIGH (ref 3.4–10.8)

## 2021-12-27 LAB — LIPID PANEL
Chol/HDL Ratio: 4.8 ratio — ABNORMAL HIGH (ref 0.0–4.4)
Cholesterol, Total: 206 mg/dL — ABNORMAL HIGH (ref 100–199)
HDL: 43 mg/dL (ref >39)
LDL Chol Calc (NIH): 141 mg/dL — ABNORMAL HIGH (ref 0–99)
Triglycerides: 121 mg/dL (ref 0–149)
VLDL Cholesterol Cal: 22 mg/dL (ref 5–40)

## 2021-12-27 LAB — HEMOGLOBIN A1C

## 2021-12-27 LAB — TSH: TSH: 3.12 u[IU]/mL (ref 0.450–4.500)

## 2021-12-28 LAB — SPECIMEN STATUS REPORT

## 2021-12-28 LAB — HEMOGLOBIN A1C
Est. average glucose Bld gHb Est-mCnc: 134 mg/dL
Hgb A1c MFr Bld: 6.3 % — ABNORMAL HIGH (ref 4.8–5.6)

## 2022-01-01 ENCOUNTER — Ambulatory Visit (INDEPENDENT_AMBULATORY_CARE_PROVIDER_SITE_OTHER): Payer: 59 | Admitting: Physician Assistant

## 2022-01-01 ENCOUNTER — Encounter: Payer: Self-pay | Admitting: Physician Assistant

## 2022-01-01 VITALS — BP 131/87 | HR 100 | Temp 97.7°F | Ht 60.0 in | Wt 261.0 lb

## 2022-01-01 DIAGNOSIS — D72829 Elevated white blood cell count, unspecified: Secondary | ICD-10-CM | POA: Diagnosis not present

## 2022-01-01 DIAGNOSIS — Z0000 Encounter for general adult medical examination without abnormal findings: Secondary | ICD-10-CM

## 2022-01-01 DIAGNOSIS — Z1211 Encounter for screening for malignant neoplasm of colon: Secondary | ICD-10-CM | POA: Diagnosis not present

## 2022-01-01 DIAGNOSIS — I10 Essential (primary) hypertension: Secondary | ICD-10-CM | POA: Diagnosis not present

## 2022-01-01 NOTE — Patient Instructions (Signed)
Preventive Care 40-64 Years Old, Female Preventive care refers to lifestyle choices and visits with your health care provider that can promote health and wellness. Preventive care visits are also called wellness exams. What can I expect for my preventive care visit? Counseling Your health care provider may ask you questions about your: Medical history, including: Past medical problems. Family medical history. Pregnancy history. Current health, including: Menstrual cycle. Method of birth control. Emotional well-being. Home life and relationship well-being. Sexual activity and sexual health. Lifestyle, including: Alcohol, nicotine or tobacco, and drug use. Access to firearms. Diet, exercise, and sleep habits. Work and work environment. Sunscreen use. Safety issues such as seatbelt and bike helmet use. Physical exam Your health care provider will check your: Height and weight. These may be used to calculate your BMI (body mass index). BMI is a measurement that tells if you are at a healthy weight. Waist circumference. This measures the distance around your waistline. This measurement also tells if you are at a healthy weight and may help predict your risk of certain diseases, such as type 2 diabetes and high blood pressure. Heart rate and blood pressure. Body temperature. Skin for abnormal spots. What immunizations do I need?  Vaccines are usually given at various ages, according to a schedule. Your health care provider will recommend vaccines for you based on your age, medical history, and lifestyle or other factors, such as travel or where you work. What tests do I need? Screening Your health care provider may recommend screening tests for certain conditions. This may include: Lipid and cholesterol levels. Diabetes screening. This is done by checking your blood sugar (glucose) after you have not eaten for a while (fasting). Pelvic exam and Pap test. Hepatitis B test. Hepatitis C  test. HIV (human immunodeficiency virus) test. STI (sexually transmitted infection) testing, if you are at risk. Lung cancer screening. Colorectal cancer screening. Mammogram. Talk with your health care provider about when you should start having regular mammograms. This may depend on whether you have a family history of breast cancer. BRCA-related cancer screening. This may be done if you have a family history of breast, ovarian, tubal, or peritoneal cancers. Bone density scan. This is done to screen for osteoporosis. Talk with your health care provider about your test results, treatment options, and if necessary, the need for more tests. Follow these instructions at home: Eating and drinking  Eat a diet that includes fresh fruits and vegetables, whole grains, lean protein, and low-fat dairy products. Take vitamin and mineral supplements as recommended by your health care provider. Do not drink alcohol if: Your health care provider tells you not to drink. You are pregnant, may be pregnant, or are planning to become pregnant. If you drink alcohol: Limit how much you have to 0-1 drink a day. Know how much alcohol is in your drink. In the [place] one drink equals one 12 oz bottle of beer (355 mL), one 5 oz glass of wine (148 mL), or one 1 oz glass of hard liquor (44 mL). Lifestyle Brush your teeth every morning and night with fluoride toothpaste. Floss one time each day. Exercise for at least 30 minutes 5 or more days each week. Do not use any products that contain nicotine or tobacco. These products include cigarettes, chewing tobacco, and vaping devices, such as e-cigarettes. If you need help quitting, ask your health care provider. Do not use drugs. If you are sexually active, practice safe sex. Use a condom or other form of protection to   prevent STIs. If you do not wish to become pregnant, use a form of birth control. If you plan to become pregnant, see your health care provider for a  prepregnancy visit. Take aspirin only as told by your health care provider. Make sure that you understand how much to take and what form to take. Work with your health care provider to find out whether it is safe and beneficial for you to take aspirin daily. Find healthy ways to manage stress, such as: Meditation, yoga, or listening to music. Journaling. Talking to a trusted person. Spending time with friends and family. Minimize exposure to UV radiation to reduce your risk of skin cancer. Safety Always wear your seat belt while driving or riding in a vehicle. Do not drive: If you have been drinking alcohol. Do not ride with someone who has been drinking. When you are tired or distracted. While texting. If you have been using any mind-altering substances or drugs. Wear a helmet and other protective equipment during sports activities. If you have firearms in your house, make sure you follow all gun safety procedures. Seek help if you have been physically or sexually abused. What's next? Visit your health care provider once a year for an annual wellness visit. Ask your health care provider how often you should have your eyes and teeth checked. Stay up to date on all vaccines. This information is not intended to replace advice given to you by your health care provider. Make sure you discuss any questions you have with your health care provider. Document Revised: 10/17/2020 Document Reviewed: 10/17/2020 Elsevier Patient Education  [place].

## 2022-01-01 NOTE — Progress Notes (Signed)
 Complete physical exam   Patient: Tiffany Reynolds   DOB: 05/13/1960   61 y.o. Female  MRN: [id number] Visit Date: 01/01/2022   Chief Complaint  Patient presents with   Annual Exam   Subjective    Briarrose L Guyett is a 61 y.o. female who presents today for a complete physical exam.  She reports consuming a general diet. The patient does not participate in regular exercise at present. She generally feels fairly well. She does not have additional problems to discuss today.     Past Medical History:  Diagnosis Date   Allergy    Anemia    Bell's palsy    Blood transfusion without reported diagnosis    Depression    Hypertension    Past Surgical History:  Procedure Laterality Date   CARPAL TUNNEL RELEASE     CESAREAN SECTION     TONSILLECTOMY     Social History   Socioeconomic History   Marital status: Married    Spouse name: Not on file   Number of children: Not on file   Years of education: Not on file   Highest education level: Not on file  Occupational History   Occupation: CNA/CMA student    Employer: [employer]  Tobacco Use   Smoking status: Never   Smokeless tobacco: Never  Substance and Sexual Activity   Alcohol use: No   Drug use: No   Sexual activity: Not on file  Other Topics Concern   Not on file  Social History Narrative   Married. Education: College.    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   No  facility-administered medications prior to visit.    Review of Systems Review of Systems:  A fourteen system review of systems was performed and found to be positive as per HPI.   Last CBC Lab Results  Component Value Date   WBC 13.0 (H) 12/26/2021   HGB 15.3 12/26/2021   HCT 46.1 12/26/2021   MCV 86 12/26/2021   MCH 28.5 12/26/2021   RDW 14.1 12/26/2021   PLT 354 12/26/2021   Last metabolic panel Lab Results  Component Value Date   GLUCOSE 84 12/26/2021   NA 142 12/26/2021   K 4.3 12/26/2021   CL 101 12/26/2021   CO2 19 (L) 12/26/2021   BUN 15 12/26/2021   CREATININE 0.59 12/26/2021   EGFR 101 12/26/2021   CALCIUM 9.5 12/26/2021   PROT 7.6 12/26/2021   ALBUMIN 4.6 12/26/2021   LABGLOB 3.0 12/26/2021   AGRATIO 1.5 12/26/2021   BILITOT 0.4 12/26/2021   ALKPHOS 196 (H) 12/26/2021   AST 19 12/26/2021   ALT 15 12/26/2021   ANIONGAP 15 04/11/2018   Last lipids Lab Results  Component Value Date   CHOL 206 (H) 12/26/2021   HDL 43 12/26/2021   LDLCALC 141 (H) 12/26/2021   TRIG 121 12/26/2021   CHOLHDL 4.8 (H) 12/26/2021   Last   hemoglobin A1c Lab Results  Component Value Date   HGBA1C CANCELED 12/26/2021   HGBA1C 6.3 (H) 12/26/2021   Last thyroid functions Lab Results  Component Value Date   TSH 3.120 12/26/2021   Last vitamin D Lab Results  Component Value Date   VD25OH 18.5 (L) 10/31/2017    Objective     BP 131/87   Pulse 100   Temp 97.7 F (36.5 C)   Ht 5' (1.524 m)   Wt 261 lb (118.4 kg)   SpO2 97%   BMI 50.97 kg/m  BP Readings from Last 3 Encounters:  01/01/22 131/87  12/26/21 138/72  04/11/18 (!) 169/89   Wt Readings from Last 3 Encounters:  01/01/22 261 lb (118.4 kg)  12/26/21 265 lb (120.2 kg)  10/31/17 270 lb (122.5 kg)      Physical Exam   General Appearance:    Alert, cooperative, in no acute distress, appears stated age   Head:    Normocephalic, without obvious abnormality, atraumatic  Eyes:    PERRL,  conjunctiva/corneas clear, EOM's intact, fundi    benign, both eyes  Ears:    Normal TM's and external ear canals, both ears  Nose:   Nares normal, septum midline, mucosa normal, no drainage    or sinus tenderness  Throat:   Lips, mucosa, and tongue normal; teeth and gums normal  Neck:   Supple, symmetrical, trachea midline, no adenopathy;    thyroid:  no enlargement/tenderness/nodules; no JVD  Back:     Symmetric, no curvature, ROM normal, no CVA tenderness  Lungs:     Clear to auscultation bilaterally, respirations unlabored  Chest Wall:    No tenderness or deformity   Heart:    Tachycardic. Normal rhythm. No murmurs, rubs, or gallops.   Breast Exam:    deferred  Abdomen:     Soft, non-tender, bowel sounds active all four quadrants,    no masses, no organomegaly  Pelvic:    deferred  Extremities:   All extremities are intact. No cyanosis or edema  Pulses:   2+ and symmetric all extremities  Skin:   Skin color, texture, turgor normal, no rashes or lesions  Lymph nodes:   Cervical and supraclavicular nodes normal  Neurologic:   CNII-XII grossly intact (hx of Bells' palsy R sided)     Last depression screening scores    01/01/2022    1:21 PM 12/26/2021    3:32 PM 10/31/2017    9:54 AM  PHQ 2/9 Scores  PHQ - 2 Score 0 1 0  PHQ- 9 Score 0 5    Last fall risk screening    01/01/2022    1:21 PM  Fall Risk   Falls in the past year? 0  Number falls in past yr: 0  Injury with Fall? 0  Risk for fall due to : No Fall Risks  Follow up Falls evaluation completed     No results found for any visits on 01/01/22.  Assessment & Plan    Routine Health Maintenance and Physical Exam  Exercise Activities and Dietary recommendations -Discussed heart healthy diet low in fat and carbohydrates. Recommend moderate exercise 150 mins/wk.  Immunization History  Administered Date(s) Administered   Hepatitis B, adult 07/26/2013, 08/29/2013, 02/22/2014   Influenza,inj,Quad PF,6+ Mos 02/22/2014    PFIZER(Purple Top)SARS-COV-2 Vaccination 02/18/2020   PPD Test 07/26/2013   Td 09/13/2013   Tdap 07/26/2013    Health Maintenance  Topic Date Due   PAP SMEAR-Modifier  Never done     Zoster Vaccines- Shingrix (1 of 2) Never done   MAMMOGRAM  03/22/2020   COVID-19 Vaccine (2 - Pfizer series) 04/14/2020   Fecal DNA (Cologuard)  12/15/2020   INFLUENZA VACCINE  12/03/2021   TETANUS/TDAP  09/14/2023   Hepatitis C Screening  Completed   HIV Screening  Completed   HPV VACCINES  Aged Out    Discussed health benefits of physical activity, and encouraged her to engage in regular exercise appropriate for her age and condition.  Problem List Items Addressed This Visit       Cardiovascular and Mediastinum   Primary hypertension   Other Visit Diagnoses     Healthcare maintenance    -  Primary   Leukocytosis, unspecified type       Relevant Orders   Ambulatory referral to Hematology / Oncology   Screening for colon cancer       Relevant Orders   Cologuard      Discussed with patient most recent lab results which are essentially within normal limits or stable from prior. Patient has chronic hx of elevated WBC and reports no prior hematology work-up, is agreeable to referral. Discussed diet and lifestyle changes to improve A1c and cholesterol. Pt denies family hx of early heart disease or CAD. The 10-year ASCVD risk score (Arnett DK, et al., 2019) is: 7.3% Pt deferred pap and referral to OB/GYN and will schedule her own mammogram. Pt agreeable to Cologuard. BP elevated on intake, repeated with improvement.    Return for HTN, prediabetes, HLD and FBW (lipid panel, cmp, a1c) in 3-4 months.       Lorrene Reid, PA-C  [facility] [phone] (phone) [phone] (fax)  [place]

## 2022-01-02 ENCOUNTER — Other Ambulatory Visit: Payer: Self-pay | Admitting: Physician Assistant

## 2022-01-02 DIAGNOSIS — Z1231 Encounter for screening mammogram for malignant neoplasm of breast: Secondary | ICD-10-CM

## 2022-01-16 ENCOUNTER — Telehealth: Payer: Self-pay | Admitting: Hematology and Oncology

## 2022-01-16 NOTE — Telephone Encounter (Signed)
Scheduled appt per 8/30 referral. Pt is aware of appt date and time. Pt is aware to arrive 15 mins prior to appt time and to bring and updated insurance card. Pt is aware of appt location.   

## 2022-01-21 ENCOUNTER — Ambulatory Visit: Payer: 59

## 2022-02-11 ENCOUNTER — Ambulatory Visit
Admission: RE | Admit: 2022-02-11 | Discharge: 2022-02-11 | Disposition: A | Discharge status: Home / Self Care | Payer: 59 | Source: Ambulatory Visit | Attending: Physician Assistant | Admitting: Physician Assistant

## 2022-02-11 ENCOUNTER — Other Ambulatory Visit: Payer: Self-pay | Admitting: Physician Assistant

## 2022-02-11 DIAGNOSIS — N6001 Solitary cyst of right breast: Secondary | ICD-10-CM

## 2022-02-11 DIAGNOSIS — Z1231 Encounter for screening mammogram for malignant neoplasm of breast: Secondary | ICD-10-CM

## 2022-02-12 ENCOUNTER — Other Ambulatory Visit: Payer: Self-pay | Admitting: Obstetrics and Gynecology

## 2022-02-12 ENCOUNTER — Inpatient Hospital Stay: Payer: 59 | Attending: Hematology and Oncology | Admitting: Hematology and Oncology

## 2022-02-12 ENCOUNTER — Ambulatory Visit: Payer: 59

## 2022-02-12 ENCOUNTER — Other Ambulatory Visit: Payer: Self-pay

## 2022-02-12 DIAGNOSIS — D72829 Elevated white blood cell count, unspecified: Secondary | ICD-10-CM | POA: Diagnosis not present

## 2022-02-12 NOTE — Progress Notes (Signed)
[place] NOTE  Patient Care Team: Lorrene Reid, PA-C as PCP - General (Physician Assistant)  CHIEF COMPLAINTS/PURPOSE OF CONSULTATION:  Elevated neutrophil count  HISTORY OF PRESENTING ILLNESS:  Tiffany Reynolds 61 y.o. female is here because of chronically elevated white blood cell count especially neutrophils.  I reviewed her records extensively and collaborated the history with the patient.  MEDICAL HISTORY:  Past Medical History:  Diagnosis Date   Allergy    Anemia    Bell's palsy    Blood transfusion without reported diagnosis    Depression    Hypertension     SURGICAL HISTORY: Past Surgical History:  Procedure Laterality Date   CARPAL TUNNEL RELEASE     CESAREAN SECTION     TONSILLECTOMY      SOCIAL HISTORY: Social History   Socioeconomic History   Marital status: Married    Spouse name: Not on file   Number of children: Not on file   Years of education: Not on file   Highest education level: Not on file  Occupational History   Occupation: CNA/CMA student    Employer: [employer]  Tobacco Use   Smoking status: Never   Smokeless tobacco: Never  Substance and Sexual Activity   Alcohol use: No   Drug use: No   Sexual activity: Not on file  Other Topics Concern   Not on file  Social History Narrative   Married. Education: [employer].    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, studying medical assisting   Reports she has a good support system of friends, family, and classmates   Social Determinants of Radio broadcast assistant Strain: Not on file  Food Insecurity: Not on file  Transportation Needs: Not on file  Physical Activity: Not on file  Stress: Not on file  Social Connections: Not on file  Intimate Partner Violence: Not on file    FAMILY HISTORY: Family History  Problem Relation Age of Onset   Cancer Mother 32       breast   Hyperlipidemia Father    Cancer Maternal Grandmother 46        breast   Cancer Paternal Aunt 75       breast    ALLERGIES:  is allergic to tetracyclines & related, albuterol, amlodipine, naproxen sodium, other, and vicodin [hydrocodone-acetaminophen].  MEDICATIONS:  Current Outpatient Medications  Medication Sig Dispense Refill   losartan-hydrochlorothiazide (HYZAAR) 50-12.5 MG tablet Take 1 tablet by mouth daily. 90 tablet 1   No current facility-administered medications for this visit.    REVIEW OF SYSTEMS:   Constitutional: Denies fevers, chills or abnormal night sweats  All other systems were reviewed with the patient and are negative.  PHYSICAL EXAMINATION: ECOG PERFORMANCE STATUS: 1  Vitals:   02/12/22 1542  BP: (!) 156/96  Pulse: (!) 115  Resp: 16  Temp: 97.9 F (36.6 C)  SpO2: 100%   Filed Weights   02/12/22 1542  Weight: 260 lb 1.6 oz (118 kg)    GENERAL:alert, no distress and comfortable  LABORATORY DATA:  I have reviewed the data as listed Lab Results  Component Value Date   WBC 13.0 (H) 12/26/2021   HGB 15.3 12/26/2021   HCT 46.1 12/26/2021   MCV 86 12/26/2021   PLT 354 12/26/2021   Lab Results  Component Value Date   NA 142 12/26/2021   K 4.3 12/26/2021   CL 101 12/26/2021   CO2 19 (L) 12/26/2021  RADIOGRAPHIC STUDIES: I have personally reviewed the radiological reports and agreed with the findings in the report.  ASSESSMENT AND PLAN:  Leukocytosis Lab review 09/13/2013: WBC 11.7 12/26/2021: WBC 13, hemoglobin 15.3, platelets 354, ANC 8.5, ALC 3, AMC 1.1  Persistently elevated white blood cell count and the sign of underlying inflammation.  Being that it is elevated neutrophils, it is either inflammation or infection.  Usually this does not require any extensive testing or evaluations.  I suspect that this is related to underlying inflammation (osteoarthritis versus NASH)  No invasive procedures like bone marrow biopsies are necessary at this time.  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 Return  to clinic on an as-needed basis.  All questions were answered. The patient knows to call the clinic with any problems, questions or concerns.    Tamsen Meek, MD 02/12/22

## 2022-02-12 NOTE — Assessment & Plan Note (Addendum)
Lab review 09/13/2013: WBC 11.7 12/26/2021: WBC 13, hemoglobin 15.3, platelets 354, ANC 8.5, ALC 3, AMC 1.1  Persistently elevated white blood cell count and the sign of underlying inflammation.  Being that it is elevated neutrophils, it is either inflammation or infection.  Usually this does not require any extensive testing or evaluations.  I suspect that this is related to underlying inflammation (osteoarthritis versus NASH)  No invasive procedures like bone marrow biopsies are necessary at this time.  Patient works at [employer] psychiat[employer]. Return to clinic on an as-needed basis.

## 2022-02-16 DIAGNOSIS — Z1211 Encounter for screening for malignant neoplasm of colon: Secondary | ICD-10-CM | POA: Diagnosis not present

## 2022-02-22 ENCOUNTER — Ambulatory Visit: Payer: 59

## 2022-02-22 ENCOUNTER — Ambulatory Visit
Admission: RE | Admit: 2022-02-22 | Discharge: 2022-02-22 | Disposition: A | Discharge status: Home / Self Care | Payer: 59 | Source: Ambulatory Visit | Attending: Physician Assistant | Admitting: Physician Assistant

## 2022-02-22 DIAGNOSIS — R92323 Mammographic fibroglandular density, bilateral breasts: Secondary | ICD-10-CM | POA: Diagnosis not present

## 2022-02-22 DIAGNOSIS — N6001 Solitary cyst of right breast: Secondary | ICD-10-CM

## 2022-02-23 LAB — COLOGUARD: COLOGUARD: NEGATIVE

## 2022-04-08 ENCOUNTER — Ambulatory Visit: Payer: 59 | Admitting: Nurse Practitioner

## 2022-06-09 ENCOUNTER — Other Ambulatory Visit: Payer: Self-pay

## 2022-06-09 DIAGNOSIS — I10 Essential (primary) hypertension: Secondary | ICD-10-CM

## 2022-06-09 MED ORDER — LOSARTAN POTASSIUM-HCTZ 50-12.5 MG PO TABS: 1.0000 | ORAL_TABLET | Freq: Every day | ORAL | 0 refills | Status: DC

## 2022-06-16 ENCOUNTER — Telehealth: Payer: Self-pay | Admitting: *Deleted

## 2022-06-16 ENCOUNTER — Other Ambulatory Visit: Payer: Self-pay | Admitting: Nurse Practitioner

## 2022-06-16 DIAGNOSIS — U071 COVID-19: Secondary | ICD-10-CM

## 2022-06-16 MED ORDER — NIRMATRELVIR/RITONAVIR (PAXLOVID)TABLET: 3.0000 | ORAL_TABLET | Freq: Two times a day (BID) | ORAL | 0 refills | Status: AC

## 2022-06-16 NOTE — Telephone Encounter (Signed)
Please let her know that I did send the [facility]lovid to [facility]. She should take 3 capsules twice daily for 5 days. She may want me to send it to [facility] instead, as it may be WAY less expensive. In addition, Rest and increase fluids. Continue using OTC medication to control symptoms.   Rest and increase fluids. Continue using OTC medication to control symptoms.

## 2022-06-16 NOTE — Telephone Encounter (Signed)
Pt calling stating she tested positive for covid, her symptoms started Saturday.   She said husband tested positive on Wednesday.  SHe would like to have some medication sent in.  She mentioned paxlovid. She would like [facility] to [facility] at [address].

## 2022-06-17 NOTE — Telephone Encounter (Signed)
Patient has been informed of prescription. Patient verbalized understanding. All questions and concerns have been addressed.

## 2022-07-30 ENCOUNTER — Encounter: Payer: Self-pay | Admitting: Nurse Practitioner

## 2022-07-30 ENCOUNTER — Ambulatory Visit (INDEPENDENT_AMBULATORY_CARE_PROVIDER_SITE_OTHER): Payer: 59 | Admitting: Nurse Practitioner

## 2022-07-30 VITALS — BP 133/88 | HR 97 | Ht 60.0 in | Wt 263.8 lb

## 2022-07-30 DIAGNOSIS — R7303 Prediabetes: Secondary | ICD-10-CM

## 2022-07-30 DIAGNOSIS — E538 Deficiency of other specified B group vitamins: Secondary | ICD-10-CM | POA: Diagnosis not present

## 2022-07-30 DIAGNOSIS — E559 Vitamin D deficiency, unspecified: Secondary | ICD-10-CM

## 2022-07-30 DIAGNOSIS — Z23 Encounter for immunization: Secondary | ICD-10-CM

## 2022-07-30 DIAGNOSIS — I10 Essential (primary) hypertension: Secondary | ICD-10-CM | POA: Diagnosis not present

## 2022-07-30 DIAGNOSIS — E785 Hyperlipidemia, unspecified: Secondary | ICD-10-CM | POA: Diagnosis not present

## 2022-07-30 DIAGNOSIS — H04123 Dry eye syndrome of bilateral lacrimal glands: Secondary | ICD-10-CM | POA: Diagnosis not present

## 2022-07-30 DIAGNOSIS — R5383 Other fatigue: Secondary | ICD-10-CM | POA: Diagnosis not present

## 2022-07-30 MED ORDER — CYCLOSPORINE 0.05 % OP EMUL
1.0000 [drp] | Freq: Two times a day (BID) | OPHTHALMIC | 5 refills | Status: DC
  Filled 2023-04-24 – 2023-05-07 (×2): qty 60, 30d supply, fill #0
  Filled 2023-06-01: qty 60, 30d supply, fill #1

## 2022-07-30 MED ORDER — LOSARTAN POTASSIUM-HCTZ 50-12.5 MG PO TABS: 1.0000 | ORAL_TABLET | Freq: Every day | ORAL | 1 refills | Status: DC

## 2022-07-30 NOTE — Assessment & Plan Note (Signed)
Bp elevated during today's visit. Has white coat syndrome. Taking blood pressure medication regularly. Continue current medication.

## 2022-07-30 NOTE — Assessment & Plan Note (Signed)
Use restasis drops in both eyes twice daily

## 2022-07-30 NOTE — Assessment & Plan Note (Signed)
Check labs today for further evaluation.

## 2022-07-30 NOTE — Assessment & Plan Note (Signed)
Check vitamin d level and treat deficiency as indicated.

## 2022-07-30 NOTE — Assessment & Plan Note (Signed)
Check fasting lipids today and treat high cholesterol as indicated.

## 2022-07-30 NOTE — Progress Notes (Signed)
Established patient visit   Patient: Tiffany Reynolds   DOB: 12/19/1960   62 y.o. Female  MRN: [id number] Visit Date: 07/30/2022   Chief Complaint  Patient presents with   Medical Management of Chronic Issues   Subjective    HPI  Hypertension  -slightly elevated at start of visit.  -she admits to having white coat hypertension  Prediabetes  History of vitamin d deficiency.  Currently has Bells Palsy. Did take B12 supplements for a while. Last check, her B12 levels were high and she stopped  --has facial asymmetry - right eye does not open completely, unable to smile fully  -has resulting Dry Eye Reports fatigue.  -fasting today for labs.  -recent lipid panel was slightly elevated  -had wanted to make some lifestyle changes, especially with exercise. She states that she has been unable make that change. Really lacks the energy and motivation.  She denies chest pain, chest pressure, or shortness of breath. She denies headaches or visual disturbances. She denies abdominal pain, nausea, vomiting, or changes in bowel or bladder habits.    Medications: Outpatient Medications Prior to Visit  Medication Sig   [DISCONTINUED] losartan-hydrochlorothiazide (HYZAAR) 50-12.5 MG tablet Take 1 tablet by mouth daily.   No facility-administered medications prior to visit.    Review of Systems See HPI     Last CBC Lab Results  Component Value Date   WBC 13.0 (H) 12/26/2021   HGB 15.3 12/26/2021   HCT 46.1 12/26/2021   MCV 86 12/26/2021   MCH 28.5 12/26/2021   RDW 14.1 12/26/2021   PLT 354 [id number]   Last metabolic panel Lab Results  Component Value Date   GLUCOSE 84 12/26/2021   NA 142 12/26/2021   K 4.3 12/26/2021   CL 101 12/26/2021   CO2 19 (L) 12/26/2021   BUN 15 12/26/2021   CREATININE 0.59 12/26/2021   EGFR 101 12/26/2021   CALCIUM 9.5 12/26/2021   PROT 7.6 12/26/2021   ALBUMIN 4.6 12/26/2021   LABGLOB 3.0 12/26/2021   AGRATIO 1.5 12/26/2021   BILITOT 0.4  12/26/2021   ALKPHOS 196 (H) 12/26/2021   AST 19 12/26/2021   ALT 15 12/26/2021   ANIONGAP 15 04/11/2018   Last lipids Lab Results  Component Value Date   CHOL 206 (H) 12/26/2021   HDL 43 12/26/2021   LDLCALC 141 (H) 12/26/2021   TRIG 121 12/26/2021   CHOLHDL 4.8 (H) 12/26/2021   Last hemoglobin A1c Lab Results  Component Value Date   HGBA1C CANCELED 12/26/2021   HGBA1C 6.3 (H) 12/26/2021   Last thyroid functions Lab Results  Component Value Date   TSH 3.120 12/26/2021   Last vitamin D Lab Results  Component Value Date   VD25OH 18.5 (L) 10/31/2017       Objective     Today's Vitals   07/30/22 0816  BP: (Abnormal) 156/93  Pulse: 97  SpO2: 98%  Weight: 263 lb 12.8 oz (119.7 kg)  Height: 5' (1.524 m)   Body mass index is 51.52 kg/m.  BP Readings from Last 3 Encounters:  07/30/22 (Abnormal) 156/93  02/12/22 (Abnormal) 156/96  01/01/22 131/87    Wt Readings from Last 3 Encounters:  07/30/22 263 lb 12.8 oz (119.7 kg)  02/12/22 260 lb 1.6 oz (118 kg)  01/01/22 261 lb (118.4 kg)    Physical Exam Vitals and nursing note reviewed.  Constitutional:      Appearance: Normal appearance. She is well-developed. She is obese.  HENT:  Head: Normocephalic and atraumatic.     Nose: Nose normal.     Mouth/Throat:     Mouth: Mucous membranes are moist.     Pharynx: Oropharynx is clear.     Comments: Asymmetry of the moth - weakness noted on right side.  Eyes:     Extraocular Movements: Extraocular movements intact.     Conjunctiva/sclera: Conjunctivae normal.     Pupils: Pupils are equal, round, and reactive to light.     Comments: Decreased movement of right eye. Unable to close or open it completely.   Neck:     Vascular: No carotid bruit.  Cardiovascular:     Rate and Rhythm: Normal rate and regular rhythm.     Pulses: Normal pulses.     Heart sounds: Normal heart sounds.  Pulmonary:     Effort: Pulmonary effort is normal.     Breath sounds: Normal  breath sounds.  Abdominal:     Palpations: Abdomen is soft.  Musculoskeletal:        General: Normal range of motion.     Cervical back: Normal range of motion and neck supple.  Lymphadenopathy:     Cervical: No cervical adenopathy.  Skin:    General: Skin is warm and dry.     Capillary Refill: Capillary refill takes less than 2 seconds.  Neurological:     General: No focal deficit present.     Mental Status: She is alert and oriented to person, place, and time.  Psychiatric:        Mood and Affect: Mood normal.        Behavior: Behavior normal.        Thought Content: Thought content normal.        Judgment: Judgment normal.      Assessment & Plan    Primary hypertension Assessment & Plan: Bp elevated during today's visit. Has white coat syndrome. Taking blood pressure medication regularly. Continue current medication.   Orders: -     Losartan Potassium-HCTZ; Take 1 tablet by mouth daily.  Dispense: 90 tablet; Refill: 1  Dyslipidemia, goal LDL below 100 Assessment & Plan: Check fasting lipids today and treat high cholesterol as indicated.  Orders: -     Lipid panel; Future -     Comprehensive metabolic panel; Future -     CBC; Future  Prediabetes -     Hemoglobin A1c; Future -     Comprehensive metabolic panel; Future -     CBC; Future  Other fatigue Assessment & Plan: Check labs today for further evaluation.   Orders: -     TSH + free T4; Future -     Comprehensive metabolic panel; Future -     CBC; Future  Dry eye syndrome of both eyes Assessment & Plan: Use restasis drops in both eyes twice daily   Orders: -     cycloSPORINE; Place 1 drop into both eyes 2 (two) times daily.  Dispense: 0.4 mL; Refill: 5  Vitamin D deficiency Assessment & Plan: Check vitamin d level and treat deficiency as indicated.    Orders: -     VITAMIN D 25 Hydroxy (Vit-D Deficiency, Fractures); Future  Vitamin B12 deficiency Assessment & Plan: Check vitamin B12 level and  treat deficiency as indicated.     Need for shingles vaccine -     Varicella-zoster vaccine IM     Return in about 4 months (around 11/29/2022) for check HgbA1c, health maintenance exam, 2nd shingles vaccine.  Ronnell Freshwater, NP  [facility] at [facility] [phone] (phone) [phone] (fax)  [place]

## 2022-07-30 NOTE — Assessment & Plan Note (Signed)
Check vitamin B12 level and treat deficiency as indicated.

## 2022-07-31 LAB — CBC
Hematocrit: 44.6 % (ref 34.0–46.6)
Hemoglobin: 14.9 g/dL (ref 11.1–15.9)
MCH: 28.4 pg (ref 26.6–33.0)
MCHC: 33.4 g/dL (ref 31.5–35.7)
MCV: 85 fL (ref 79–97)
Platelets: 343 10*3/uL (ref 150–450)
RBC: 5.24 x10E6/uL (ref 3.77–5.28)
RDW: 13.1 % (ref 11.7–15.4)
WBC: 10.8 10*3/uL (ref 3.4–10.8)

## 2022-07-31 LAB — COMPREHENSIVE METABOLIC PANEL
ALT: 21 IU/L (ref 0–32)
AST: 20 IU/L (ref 0–40)
Albumin/Globulin Ratio: 1.4 (ref 1.2–2.2)
Albumin: 4.3 g/dL (ref 3.9–4.9)
Alkaline Phosphatase: 172 IU/L — ABNORMAL HIGH (ref 44–121)
BUN/Creatinine Ratio: 23 (ref 12–28)
BUN: 14 mg/dL (ref 8–27)
Bilirubin Total: 0.5 mg/dL (ref 0.0–1.2)
CO2: 21 mmol/L (ref 20–29)
Calcium: 9.3 mg/dL (ref 8.7–10.3)
Chloride: 101 mmol/L (ref 96–106)
Creatinine, Ser: 0.62 mg/dL (ref 0.57–1.00)
Globulin, Total: 3 g/dL (ref 1.5–4.5)
Glucose: 134 mg/dL — ABNORMAL HIGH (ref 70–99)
Potassium: 4.4 mmol/L (ref 3.5–5.2)
Sodium: 139 mmol/L (ref 134–144)
Total Protein: 7.3 g/dL (ref 6.0–8.5)
eGFR: 100 mL/min/{1.73_m2} (ref >59)

## 2022-07-31 LAB — TSH+FREE T4
Free T4: 1.33 ng/dL (ref 0.82–1.77)
TSH: 2.5 u[IU]/mL (ref 0.450–4.500)

## 2022-07-31 LAB — LIPID PANEL
Chol/HDL Ratio: 4.3 ratio (ref 0.0–4.4)
Cholesterol, Total: 191 mg/dL (ref 100–199)
HDL: 44 mg/dL (ref >39)
LDL Chol Calc (NIH): 130 mg/dL — ABNORMAL HIGH (ref 0–99)
Triglycerides: 95 mg/dL (ref 0–149)
VLDL Cholesterol Cal: 17 mg/dL (ref 5–40)

## 2022-07-31 LAB — HEMOGLOBIN A1C
Est. average glucose Bld gHb Est-mCnc: 140 mg/dL
Hgb A1c MFr Bld: 6.5 % — ABNORMAL HIGH (ref 4.8–5.6)

## 2022-07-31 LAB — VITAMIN D 25 HYDROXY (VIT D DEFICIENCY, FRACTURES): Vit D, 25-Hydroxy: 29.1 ng/mL — ABNORMAL LOW (ref 30.0–100.0)

## 2022-09-25 DIAGNOSIS — H35372 Puckering of macula, left eye: Secondary | ICD-10-CM | POA: Diagnosis not present

## 2022-09-25 DIAGNOSIS — H34832 Tributary (branch) retinal vein occlusion, left eye, with macular edema: Secondary | ICD-10-CM | POA: Diagnosis not present

## 2022-09-25 DIAGNOSIS — H3509 Other intraretinal microvascular abnormalities: Secondary | ICD-10-CM | POA: Diagnosis not present

## 2022-09-25 DIAGNOSIS — H43811 Vitreous degeneration, right eye: Secondary | ICD-10-CM | POA: Diagnosis not present

## 2022-10-08 DIAGNOSIS — H34832 Tributary (branch) retinal vein occlusion, left eye, with macular edema: Secondary | ICD-10-CM | POA: Diagnosis not present

## 2022-10-14 DIAGNOSIS — H348322 Tributary (branch) retinal vein occlusion, left eye, stable: Secondary | ICD-10-CM | POA: Diagnosis not present

## 2022-10-14 DIAGNOSIS — H4312 Vitreous hemorrhage, left eye: Secondary | ICD-10-CM | POA: Diagnosis not present

## 2022-10-14 DIAGNOSIS — H35372 Puckering of macula, left eye: Secondary | ICD-10-CM | POA: Diagnosis not present

## 2022-10-23 DIAGNOSIS — H348321 Tributary (branch) retinal vein occlusion, left eye, with retinal neovascularization: Secondary | ICD-10-CM | POA: Diagnosis not present

## 2022-10-23 DIAGNOSIS — H4312 Vitreous hemorrhage, left eye: Secondary | ICD-10-CM | POA: Diagnosis not present

## 2022-10-23 DIAGNOSIS — H35372 Puckering of macula, left eye: Secondary | ICD-10-CM | POA: Diagnosis not present

## 2022-10-23 DIAGNOSIS — Z9889 Other specified postprocedural states: Secondary | ICD-10-CM | POA: Diagnosis not present

## 2022-11-03 NOTE — Progress Notes (Signed)
Will need recheck hemoglobin A1c at next visit.  Other labs essentially normal.

## 2022-11-28 DIAGNOSIS — H35372 Puckering of macula, left eye: Secondary | ICD-10-CM | POA: Diagnosis not present

## 2022-12-04 ENCOUNTER — Encounter: Payer: Commercial Managed Care - PPO | Admitting: Family Medicine

## 2023-01-09 DIAGNOSIS — H5213 Myopia, bilateral: Secondary | ICD-10-CM | POA: Diagnosis not present

## 2023-01-28 DIAGNOSIS — H2511 Age-related nuclear cataract, right eye: Secondary | ICD-10-CM | POA: Diagnosis not present

## 2023-01-28 DIAGNOSIS — H25811 Combined forms of age-related cataract, right eye: Secondary | ICD-10-CM | POA: Diagnosis not present

## 2023-01-28 DIAGNOSIS — H2512 Age-related nuclear cataract, left eye: Secondary | ICD-10-CM | POA: Diagnosis not present

## 2023-01-31 ENCOUNTER — Other Ambulatory Visit: Payer: Self-pay | Admitting: Nurse Practitioner

## 2023-01-31 DIAGNOSIS — I10 Essential (primary) hypertension: Secondary | ICD-10-CM

## 2023-02-02 MED ORDER — LOSARTAN POTASSIUM-HCTZ 50-12.5 MG PO TABS: 1.0000 | ORAL_TABLET | Freq: Every day | ORAL | 1 refills | Status: DC

## 2023-02-11 DIAGNOSIS — H25812 Combined forms of age-related cataract, left eye: Secondary | ICD-10-CM | POA: Diagnosis not present

## 2023-02-11 DIAGNOSIS — H2512 Age-related nuclear cataract, left eye: Secondary | ICD-10-CM | POA: Diagnosis not present

## 2023-02-12 ENCOUNTER — Encounter: Payer: 59 | Admitting: Family Medicine

## 2023-04-07 ENCOUNTER — Encounter: Payer: 59 | Admitting: Family Medicine

## 2023-04-09 ENCOUNTER — Other Ambulatory Visit: Payer: Self-pay | Admitting: Family Medicine

## 2023-04-09 DIAGNOSIS — Z1231 Encounter for screening mammogram for malignant neoplasm of breast: Secondary | ICD-10-CM

## 2023-04-24 ENCOUNTER — Other Ambulatory Visit (HOSPITAL_COMMUNITY): Payer: Self-pay

## 2023-04-24 DIAGNOSIS — H04123 Dry eye syndrome of bilateral lacrimal glands: Secondary | ICD-10-CM | POA: Diagnosis not present

## 2023-05-01 ENCOUNTER — Other Ambulatory Visit (HOSPITAL_COMMUNITY): Payer: Self-pay

## 2023-05-04 ENCOUNTER — Other Ambulatory Visit (HOSPITAL_COMMUNITY): Payer: Self-pay

## 2023-05-04 MED ORDER — TYRVAYA 0.03 MG/ACT NA SOLN
1.0000 | Freq: Two times a day (BID) | NASAL | 14 refills | Status: AC
  Filled 2023-05-04: qty 8.4, 30d supply, fill #0
  Filled 2023-06-01: qty 8.4, 30d supply, fill #1

## 2023-05-05 ENCOUNTER — Other Ambulatory Visit (HOSPITAL_COMMUNITY): Payer: Self-pay

## 2023-05-05 MED ORDER — VARENICLINE TARTRATE 0.03 MG/ACT NA SOLN
1.0000 | Freq: Two times a day (BID) | NASAL | 4 refills | Status: DC
  Filled 2023-05-05: qty 8.4, 30d supply, fill #0

## 2023-05-07 ENCOUNTER — Other Ambulatory Visit (HOSPITAL_COMMUNITY): Payer: Self-pay

## 2023-05-07 ENCOUNTER — Ambulatory Visit
Admission: RE | Admit: 2023-05-07 | Discharge: 2023-05-07 | Disposition: A | Discharge status: Home / Self Care | Payer: 59 | Source: Ambulatory Visit | Attending: Family Medicine | Admitting: Family Medicine

## 2023-05-07 DIAGNOSIS — Z1231 Encounter for screening mammogram for malignant neoplasm of breast: Secondary | ICD-10-CM | POA: Diagnosis not present

## 2023-06-02 ENCOUNTER — Other Ambulatory Visit (HOSPITAL_COMMUNITY): Payer: Self-pay

## 2023-06-05 DIAGNOSIS — H04123 Dry eye syndrome of bilateral lacrimal glands: Secondary | ICD-10-CM | POA: Diagnosis not present

## 2023-06-05 DIAGNOSIS — Z0100 Encounter for examination of eyes and vision without abnormal findings: Secondary | ICD-10-CM | POA: Diagnosis not present

## 2023-06-05 DIAGNOSIS — G510 Bell's palsy: Secondary | ICD-10-CM | POA: Diagnosis not present

## 2023-06-05 DIAGNOSIS — H26493 Other secondary cataract, bilateral: Secondary | ICD-10-CM | POA: Diagnosis not present

## 2023-06-10 ENCOUNTER — Other Ambulatory Visit: Payer: 59

## 2023-06-11 ENCOUNTER — Encounter: Payer: Self-pay | Admitting: Family Medicine

## 2023-06-12 ENCOUNTER — Other Ambulatory Visit (HOSPITAL_COMMUNITY): Payer: Self-pay

## 2023-06-17 ENCOUNTER — Encounter: Payer: 59 | Admitting: Family Medicine

## 2023-08-06 ENCOUNTER — Other Ambulatory Visit: Payer: Self-pay | Admitting: Family Medicine

## 2023-08-06 DIAGNOSIS — I10 Essential (primary) hypertension: Secondary | ICD-10-CM

## 2023-08-06 NOTE — Telephone Encounter (Signed)
 Pt has canceled 4 appointments since she was last since 3/24.

## 2023-09-08 ENCOUNTER — Telehealth: Admitting: Physician Assistant

## 2023-09-08 DIAGNOSIS — H26493 Other secondary cataract, bilateral: Secondary | ICD-10-CM | POA: Diagnosis not present

## 2023-09-08 DIAGNOSIS — I10 Essential (primary) hypertension: Secondary | ICD-10-CM | POA: Diagnosis not present

## 2023-09-08 DIAGNOSIS — H04123 Dry eye syndrome of bilateral lacrimal glands: Secondary | ICD-10-CM | POA: Diagnosis not present

## 2023-09-08 MED ORDER — LOSARTAN POTASSIUM-HCTZ 50-12.5 MG PO TABS: 1.0000 | ORAL_TABLET | Freq: Every day | ORAL | 0 refills | Status: AC

## 2023-09-08 NOTE — Progress Notes (Signed)
 Virtual Visit Consent   Tiffany Reynolds, you are scheduled for a virtual visit with a [facility] provider today. Just as with appointments in the office, your consent must be obtained to participate. Your consent will be active for this visit and any virtual visit you may have with one of our providers in the next 365 days. If you have a MyChart account, a copy of this consent can be sent to you electronically.  As this is a virtual visit, video technology does not allow for your provider to perform a traditional examination. This may limit your provider's ability to fully assess your condition. If your provider identifies any concerns that need to be evaluated in person or the need to arrange testing (such as labs, EKG, etc.), we will make arrangements to do so. Although advances in technology are sophisticated, we cannot ensure that it will always work on either your end or our end. If the connection with a video visit is poor, the visit may have to be switched to a telephone visit. With either a video or telephone visit, we are not always able to ensure that we have a secure connection.  By engaging in this virtual visit, you consent to the provision of healthcare and authorize for your insurance to be billed (if applicable) for the services provided during this visit. Depending on your insurance coverage, you may receive a charge related to this service.  I need to obtain your verbal consent now. Are you willing to proceed with your visit today? Tiffany Reynolds has provided verbal consent on 09/08/2023 for a virtual visit (video or telephone). Angelia Kelp, PA-C  Date: 09/08/2023 3:30 PM   Virtual Visit via Video Note   I, Angelia Kelp, connected with  Tiffany Reynolds  ([id number], [birth year]) on 09/08/23 at  3:30 PM EDT by a video-enabled telemedicine application and verified that I am speaking with the correct person using two identifiers.  Location: Patient: Virtual Visit  Location Patient: Home Provider: Virtual Visit Location Provider: Home Office   I discussed the limitations of evaluation and management by telemedicine and the availability of in person appointments. The patient expressed understanding and agreed to proceed.    History of Present Illness: Tiffany Reynolds is a 63 y.o. who identifies as a female who was assigned female at birth, and is being seen today for medication refill. Her PCP left the practice and the practice has had 3 providers leave in the last year, so she is having to establish with a completely new practice. She is needing a refill on her Hyzaar 50-12.5mg . Reports BP has been well controlled with this. Denies chest pain, shortness of breath, edema, lightheadedness/dizziness, headache, or visual changes.   Problems:  Patient Active Problem List   Diagnosis Date Noted   Dyslipidemia, goal LDL below 100 07/30/2022   Prediabetes 07/30/2022   Other fatigue 07/30/2022   Dry eye syndrome of both eyes 07/30/2022   Vitamin D  deficiency 07/30/2022   Vitamin B12 deficiency 07/30/2022   Leukocytosis 02/12/2022   Stress incontinence of urine 12/26/2021   Obesity 12/26/2021   Primary hypertension 12/26/2021   Facial paralysis/Bells palsy 08/21/2017   Cough 08/21/2017   Family history of malignant neoplasm of breast 10/21/2013    Allergies:  Allergies  Allergen Reactions   Tetracyclines & Related Other (See Comments)    Serious yeast infection    Albuterol Other (See Comments)    Increased wheezing   Amlodipine Other (See Comments)  angioedema   Naproxen Sodium Swelling and Other (See Comments)    Hands swelled and skin peeled   Other Itching    Epidural in 1990 and 1994 (birth of children) caused severe itching   Vicodin [Hydrocodone -Acetaminophen ] Itching    Facial itching   Medications:  Current Outpatient Medications:    losartan -hydrochlorothiazide (HYZAAR) 50-12.5 MG tablet, Take 1 tablet by mouth daily., Disp: 90  tablet, Rfl: 0   Varenicline  Tartrate (TYRVAYA ) 0.03 MG/ACT SOLN, Place 1 spray into both nostrils 2 (two) times daily 12 hours apart., Disp: 8.4 mL, Rfl: 14  Observations/Objective: Patient is well-developed, well-nourished in no acute distress.  Resting comfortably at home.  Head is normocephalic, atraumatic.  No labored breathing.  Speech is clear and coherent with logical content.  Patient is alert and oriented at baseline.    Assessment and Plan: 1. Primary hypertension - losartan -hydrochlorothiazide (HYZAAR) 50-12.5 MG tablet; Take 1 tablet by mouth daily.  Dispense: 90 tablet; Refill: 0  - Medication refilled x 90 days - Link and phone number provided in AVS to help establish with a new PCP  Follow Up Instructions: I discussed the assessment and treatment plan with the patient. The patient was provided an opportunity to ask questions and all were answered. The patient agreed with the plan and demonstrated an understanding of the instructions.  A copy of instructions were sent to the patient via MyChart unless otherwise noted below.    The patient was advised to call back or seek an in-person evaluation if the symptoms worsen or if the condition fails to improve as anticipated.    Angelia Kelp, PA-C

## 2023-09-08 NOTE — Patient Instructions (Signed)
 Tiffany Reynolds, thank you for joining Angelia Kelp, PA-C for today's virtual visit.  While this provider is not your primary care provider (PCP), if your PCP is located in our provider database this encounter information will be shared with them immediately following your visit.   A [facility] MyChart account gives you access to today's visit and all your visits, tests, and labs performed at [facility] " click here if you don't have a [facility] MyChart account or go to [email or web address]  Consent: (Patient) Tiffany Reynolds provided verbal consent for this virtual visit at the beginning of the encounter.  Current Medications:  Current Outpatient Medications:    losartan -hydrochlorothiazide (HYZAAR) 50-12.5 MG tablet, Take 1 tablet by mouth daily., Disp: 90 tablet, Rfl: 0   Varenicline  Tartrate (TYRVAYA ) 0.03 MG/ACT SOLN, Place 1 spray into both nostrils 2 (two) times daily 12 hours apart., Disp: 8.4 mL, Rfl: 14   Medications ordered in this encounter:  Meds ordered this encounter  Medications   losartan -hydrochlorothiazide (HYZAAR) 50-12.5 MG tablet    Sig: Take 1 tablet by mouth daily.    Dispense:  90 tablet    Refill:  0    Supervising Provider:   Corine Dice [[id number]]     *If you need refills on other medications prior to your next appointment, please contact your pharmacy*  Follow-Up: Call back or seek an in-person evaluation if the symptoms worsen or if the condition fails to improve as anticipated.  [facility] [phone]  Other Instructions  If you do not have a PCP, [facility] offers a free physician referral service available at [phone]. Our trained staff has the experience, knowledge and resources to put you in touch with a physician who is right for you.    DASH Eating Plan DASH stands for Dietary Approaches to Stop Hypertension. The DASH eating plan is a healthy eating plan that has been shown to: Lower  high blood pressure (hypertension). Reduce your risk for type 2 diabetes, heart disease, and stroke. Help with weight loss. What are tips for following this plan? Reading food labels Check food labels for the amount of salt (sodium) per serving. Choose foods with less than 5 percent of the Daily Value (DV) of sodium. In general, foods with less than 300 milligrams (mg) of sodium per serving fit into this eating plan. To find whole grains, look for the word "whole" as the first word in the ingredient list. Shopping Buy products labeled as "low-sodium" or "no salt added." Buy fresh foods. Avoid canned foods and pre-made or frozen meals. Cooking Try not to add salt when you cook. Use salt-free seasonings or herbs instead of table salt or sea salt. Check with your health care provider or pharmacist before using salt substitutes. Do not fry foods. Cook foods in healthy ways, such as baking, boiling, grilling, roasting, or broiling. Cook using oils that are good for your heart. These include olive, canola, avocado, soybean, and sunflower oil. Meal planning  Eat a balanced diet. This should include: 4 or more servings of fruits and 4 or more servings of vegetables each day. Try to fill half of your plate with fruits and vegetables. 6-8 servings of whole grains each day. 6 or less servings of lean meat, poultry, or fish each day. 1 oz is 1 serving. A 3 oz (85 g) serving of meat is about the same size as the palm of your hand. One egg is 1 oz (28 g).  2-3 servings of low-fat dairy each day. One serving is 1 cup (237 mL). 1 serving of nuts, seeds, or beans 5 times each week. 2-3 servings of heart-healthy fats. Healthy fats called omega-3 fatty acids are found in foods such as walnuts, flaxseeds, fortified milks, and eggs. These fats are also found in cold-water fish, such as sardines, salmon, and mackerel. Limit how much you eat of: Canned or prepackaged foods. Food that is high in trans fat, such as  fried foods. Food that is high in saturated fat, such as fatty meat. Desserts and other sweets, sugary drinks, and other foods with added sugar. Full-fat dairy products. Do not salt foods before eating. Do not eat more than 4 egg yolks a week. Try to eat at least 2 vegetarian meals a week. Eat more home-cooked food and less restaurant, buffet, and fast food. Lifestyle When eating at a restaurant, ask if your food can be made with less salt or no salt. If you drink alcohol: Limit how much you have to: 0-1 drink a day if you are female. 0-2 drinks a day if you are female. Know how much alcohol is in your drink. In the [place] one drink is one 12 oz bottle of beer (355 mL), one 5 oz glass of wine (148 mL), or one 1 oz glass of hard liquor (44 mL). General information Avoid eating more than 2,300 mg of salt a day. If you have hypertension, you may need to reduce your sodium intake to 1,500 mg a day. Work with your provider to stay at a healthy body weight or lose weight. Ask what the best weight range is for you. On most days of the week, get at least 30 minutes of exercise that causes your heart to beat faster. This may include walking, swimming, or biking. Work with your provider or dietitian to adjust your eating plan to meet your specific calorie needs. What foods should I eat? Fruits All fresh, dried, or frozen fruit. Canned fruits that are in their natural juice and do not have sugar added to them. Vegetables Fresh or frozen vegetables that are raw, steamed, roasted, or grilled. Low-sodium or reduced-sodium tomato and vegetable juice. Low-sodium or reduced-sodium tomato sauce and tomato paste. Low-sodium or reduced-sodium canned vegetables. Grains Whole-grain or whole-wheat bread. Whole-grain or whole-wheat pasta. Brown rice. Dwyane Glad. Bulgur. Whole-grain and low-sodium cereals. Pita bread. Low-fat, low-sodium crackers. Whole-wheat flour tortillas. Meats and other proteins Skinless  chicken or [place]. Ground chicken or [place]. Pork with fat trimmed off. Fish and seafood. Egg whites. Dried beans, peas, or lentils. Unsalted nuts, nut butters, and seeds. Unsalted canned beans. Lean cuts of beef with fat trimmed off. Low-sodium, lean precooked or cured meat, such as sausages or meat loaves. Dairy Low-fat (1%) or fat-free (skim) milk. Reduced-fat, low-fat, or fat-free cheeses. Nonfat, low-sodium ricotta or cottage cheese. Low-fat or nonfat yogurt. Low-fat, low-sodium cheese. Fats and oils Soft margarine without trans fats. Vegetable oil. Reduced-fat, low-fat, or light mayonnaise and salad dressings (reduced-sodium). Canola, safflower, olive, avocado, soybean, and sunflower oils. Avocado. Seasonings and condiments Herbs. Spices. Seasoning mixes without salt. Other foods Unsalted popcorn and pretzels. Fat-free sweets. The items listed above may not be all the foods and drinks you can have. Talk to a dietitian to learn more. What foods should I avoid? Fruits Canned fruit in a light or heavy syrup. Fried fruit. Fruit in cream or butter sauce. Vegetables Creamed or fried vegetables. Vegetables in a cheese sauce. Regular canned vegetables that are  not marked as low-sodium or reduced-sodium. Regular canned tomato sauce and paste that are not marked as low-sodium or reduced-sodium. Regular tomato and vegetable juices that are not marked as low-sodium or reduced-sodium. Vanessa General. Olives. Grains Baked goods made with fat, such as croissants, muffins, or some breads. Dry pasta or rice meal packs. Meats and other proteins Fatty cuts of meat. Ribs. Fried meat. Helene Loader. Bologna, salami, and other precooked or cured meats, such as sausages or meat loaves, that are not lean and low in sodium. Fat from the back of a pig (fatback). Bratwurst. Salted nuts and seeds. Canned beans with added salt. Canned or smoked fish. Whole eggs or egg yolks. Chicken or [place] with skin. Dairy Whole or 2% milk, cream,  and half-and-half. Whole or full-fat cream cheese. Whole-fat or sweetened yogurt. Full-fat cheese. Nondairy creamers. Whipped toppings. Processed cheese and cheese spreads. Fats and oils Butter. Stick margarine. Lard. Shortening. Ghee. Bacon fat. Tropical oils, such as coconut, palm kernel, or palm oil. Seasonings and condiments Onion salt, garlic salt, seasoned salt, table salt, and sea salt. Worcestershire sauce. Tartar sauce. Barbecue sauce. Teriyaki sauce. Soy sauce, including reduced-sodium soy sauce. Steak sauce. Canned and packaged gravies. Fish sauce. Oyster sauce. Cocktail sauce. Store-bought horseradish. Ketchup. Mustard. Meat flavorings and tenderizers. Bouillon cubes. Hot sauces. Pre-made or packaged marinades. Pre-made or packaged taco seasonings. Relishes. Regular salad dressings. Other foods Salted popcorn and pretzels. The items listed above may not be all the foods and drinks you should avoid. Talk to a dietitian to learn more. Where to find more information National Heart, Lung, and Blood Institute (NHLBI): BuffaloDryCleaner.gl American Heart Association (AHA): heart.org Academy of Nutrition and Dietetics: eatright.org National Kidney Foundation (NKF): kidney.org This information is not intended to replace advice given to you by your health care provider. Make sure you discuss any questions you have with your health care provider. Document Revised: 05/08/2022 Document Reviewed: 05/08/2022 Elsevier Patient Education  2024 Elsevier Inc.   If you have been instructed to have an in-person evaluation today at a local Urgent Care facility, please use the link below. It will take you to a list of all of our available [facility], including address, phone number and hours of operation. Please do not delay care.  [facility]  If you or a family member do not have a primary care provider, use the link below to schedule a visit and establish care. When you choose a  [facility] primary care physician or advanced practice provider, you gain a long-term partner in health. Find a Primary Care Provider  Learn more about [facility]'s in-office and virtual care options: [facility] - Get Care Now

## 2023-10-08 ENCOUNTER — Telehealth: Payer: Self-pay | Admitting: Psychiatry

## 2023-10-08 NOTE — Telephone Encounter (Signed)
 Error

## 2023-10-19 DIAGNOSIS — H26491 Other secondary cataract, right eye: Secondary | ICD-10-CM | POA: Diagnosis not present

## 2023-11-02 DIAGNOSIS — H26492 Other secondary cataract, left eye: Secondary | ICD-10-CM | POA: Diagnosis not present

## 2024-01-27 DIAGNOSIS — H31092 Other chorioretinal scars, left eye: Secondary | ICD-10-CM | POA: Diagnosis not present

## 2024-01-27 DIAGNOSIS — H43811 Vitreous degeneration, right eye: Secondary | ICD-10-CM | POA: Diagnosis not present

## 2024-01-27 DIAGNOSIS — H34832 Tributary (branch) retinal vein occlusion, left eye, with macular edema: Secondary | ICD-10-CM | POA: Diagnosis not present

## 2024-01-27 DIAGNOSIS — H04123 Dry eye syndrome of bilateral lacrimal glands: Secondary | ICD-10-CM | POA: Diagnosis not present
